# Patient Record
Sex: Female | Born: 1950 | Race: White | Hispanic: No | Marital: Married | Smoking: Never smoker
Health system: Southern US, Community
[De-identification: ages and names within clinical notes are randomized; demographics above are authoritative.]

## PROBLEM LIST (undated history)

## (undated) DIAGNOSIS — T7840XA Allergy, unspecified, initial encounter: Secondary | ICD-10-CM

## (undated) DIAGNOSIS — R112 Nausea with vomiting, unspecified: Secondary | ICD-10-CM

## (undated) DIAGNOSIS — Z9889 Other specified postprocedural states: Secondary | ICD-10-CM

## (undated) HISTORY — PX: TUBAL LIGATION: SHX77

## (undated) HISTORY — DX: Allergy, unspecified, initial encounter: T78.40XA

## (undated) HISTORY — PX: OTHER SURGICAL HISTORY: SHX169

---

## 1997-08-17 ENCOUNTER — Ambulatory Visit (HOSPITAL_COMMUNITY): Admission: RE | Admit: 1997-08-17 | Discharge: 1997-08-17 | Payer: Self-pay | Admitting: *Deleted

## 1998-12-08 ENCOUNTER — Other Ambulatory Visit: Admission: RE | Admit: 1998-12-08 | Discharge: 1998-12-08 | Payer: Self-pay | Admitting: Obstetrics and Gynecology

## 1999-02-08 ENCOUNTER — Ambulatory Visit (HOSPITAL_COMMUNITY): Admission: RE | Admit: 1999-02-08 | Discharge: 1999-02-08 | Payer: Self-pay | Admitting: Obstetrics and Gynecology

## 1999-02-08 ENCOUNTER — Encounter: Payer: Self-pay | Admitting: Obstetrics and Gynecology

## 1999-10-07 ENCOUNTER — Encounter: Payer: Self-pay | Admitting: Obstetrics and Gynecology

## 1999-10-07 ENCOUNTER — Encounter: Admission: RE | Admit: 1999-10-07 | Discharge: 1999-10-07 | Payer: Self-pay | Admitting: Obstetrics and Gynecology

## 2000-05-10 ENCOUNTER — Encounter: Payer: Self-pay | Admitting: Obstetrics and Gynecology

## 2000-05-10 ENCOUNTER — Ambulatory Visit (HOSPITAL_COMMUNITY): Admission: RE | Admit: 2000-05-10 | Discharge: 2000-05-10 | Payer: Self-pay | Admitting: Obstetrics and Gynecology

## 2001-05-28 ENCOUNTER — Other Ambulatory Visit: Admission: RE | Admit: 2001-05-28 | Discharge: 2001-05-28 | Payer: Self-pay | Admitting: Obstetrics and Gynecology

## 2001-07-22 ENCOUNTER — Ambulatory Visit (HOSPITAL_COMMUNITY): Admission: RE | Admit: 2001-07-22 | Discharge: 2001-07-22 | Payer: Self-pay | Admitting: Obstetrics and Gynecology

## 2001-07-22 ENCOUNTER — Encounter: Payer: Self-pay | Admitting: Obstetrics and Gynecology

## 2002-07-22 ENCOUNTER — Other Ambulatory Visit: Admission: RE | Admit: 2002-07-22 | Discharge: 2002-07-22 | Payer: Self-pay | Admitting: Obstetrics and Gynecology

## 2002-07-25 ENCOUNTER — Ambulatory Visit (HOSPITAL_COMMUNITY): Admission: RE | Admit: 2002-07-25 | Discharge: 2002-07-25 | Payer: Self-pay | Admitting: Obstetrics and Gynecology

## 2002-07-25 ENCOUNTER — Encounter: Payer: Self-pay | Admitting: Obstetrics and Gynecology

## 2003-09-22 ENCOUNTER — Other Ambulatory Visit: Admission: RE | Admit: 2003-09-22 | Discharge: 2003-09-22 | Payer: Self-pay | Admitting: Obstetrics and Gynecology

## 2004-09-28 ENCOUNTER — Other Ambulatory Visit: Admission: RE | Admit: 2004-09-28 | Discharge: 2004-09-28 | Payer: Self-pay | Admitting: Family Medicine

## 2004-10-03 ENCOUNTER — Ambulatory Visit (HOSPITAL_COMMUNITY): Admission: RE | Admit: 2004-10-03 | Discharge: 2004-10-03 | Payer: Self-pay | Admitting: Family Medicine

## 2005-08-21 ENCOUNTER — Encounter: Admission: RE | Admit: 2005-08-21 | Discharge: 2005-09-07 | Payer: Self-pay | Admitting: Family Medicine

## 2005-10-05 ENCOUNTER — Ambulatory Visit (HOSPITAL_COMMUNITY): Admission: RE | Admit: 2005-10-05 | Discharge: 2005-10-05 | Payer: Self-pay | Admitting: Family Medicine

## 2006-10-30 ENCOUNTER — Ambulatory Visit (HOSPITAL_COMMUNITY): Admission: RE | Admit: 2006-10-30 | Discharge: 2006-10-30 | Payer: Self-pay | Admitting: Family Medicine

## 2008-02-13 ENCOUNTER — Ambulatory Visit (HOSPITAL_COMMUNITY): Admission: RE | Admit: 2008-02-13 | Discharge: 2008-02-13 | Payer: Self-pay | Admitting: Family Medicine

## 2008-02-28 ENCOUNTER — Other Ambulatory Visit: Admission: RE | Admit: 2008-02-28 | Discharge: 2008-02-28 | Payer: Self-pay | Admitting: Family Medicine

## 2008-03-10 ENCOUNTER — Ambulatory Visit (HOSPITAL_COMMUNITY): Admission: RE | Admit: 2008-03-10 | Discharge: 2008-03-10 | Payer: Self-pay | Admitting: Family Medicine

## 2009-02-16 ENCOUNTER — Ambulatory Visit (HOSPITAL_COMMUNITY): Admission: RE | Admit: 2009-02-16 | Discharge: 2009-02-16 | Payer: Self-pay | Admitting: Family Medicine

## 2010-02-13 ENCOUNTER — Encounter: Payer: Self-pay | Admitting: Family Medicine

## 2010-02-16 ENCOUNTER — Other Ambulatory Visit (HOSPITAL_COMMUNITY): Payer: Self-pay | Admitting: Family Medicine

## 2010-02-16 DIAGNOSIS — Z139 Encounter for screening, unspecified: Secondary | ICD-10-CM

## 2010-02-22 ENCOUNTER — Ambulatory Visit (HOSPITAL_COMMUNITY)
Admission: RE | Admit: 2010-02-22 | Discharge: 2010-02-22 | Payer: Self-pay | Source: Home / Self Care | Attending: Family Medicine | Admitting: Family Medicine

## 2010-02-22 ENCOUNTER — Ambulatory Visit (HOSPITAL_COMMUNITY)
Admission: RE | Admit: 2010-02-22 | Discharge: 2010-02-22 | Disposition: A | Discharge status: Home / Self Care | Payer: Self-pay | Source: Ambulatory Visit | Attending: Family Medicine | Admitting: Family Medicine

## 2010-02-22 DIAGNOSIS — Z139 Encounter for screening, unspecified: Secondary | ICD-10-CM

## 2011-06-26 ENCOUNTER — Other Ambulatory Visit (HOSPITAL_COMMUNITY): Payer: Self-pay | Admitting: Family Medicine

## 2011-06-26 DIAGNOSIS — Z1231 Encounter for screening mammogram for malignant neoplasm of breast: Secondary | ICD-10-CM

## 2011-07-14 ENCOUNTER — Ambulatory Visit (HOSPITAL_COMMUNITY)
Admission: RE | Admit: 2011-07-14 | Discharge: 2011-07-14 | Disposition: A | Discharge status: Home / Self Care | Payer: BC Managed Care – PPO | Source: Ambulatory Visit | Attending: Family Medicine | Admitting: Family Medicine

## 2011-07-14 DIAGNOSIS — Z1231 Encounter for screening mammogram for malignant neoplasm of breast: Secondary | ICD-10-CM | POA: Insufficient documentation

## 2011-10-23 ENCOUNTER — Other Ambulatory Visit (HOSPITAL_COMMUNITY)
Admission: RE | Admit: 2011-10-23 | Discharge: 2011-10-23 | Disposition: A | Discharge status: Home / Self Care | Payer: BC Managed Care – PPO | Source: Ambulatory Visit | Attending: Family Medicine | Admitting: Family Medicine

## 2011-10-23 DIAGNOSIS — Z124 Encounter for screening for malignant neoplasm of cervix: Secondary | ICD-10-CM | POA: Insufficient documentation

## 2011-10-23 DIAGNOSIS — Z1151 Encounter for screening for human papillomavirus (HPV): Secondary | ICD-10-CM | POA: Insufficient documentation

## 2012-07-18 ENCOUNTER — Other Ambulatory Visit (HOSPITAL_COMMUNITY): Payer: Self-pay | Admitting: Family Medicine

## 2012-07-18 DIAGNOSIS — Z1231 Encounter for screening mammogram for malignant neoplasm of breast: Secondary | ICD-10-CM

## 2012-08-31 ENCOUNTER — Encounter: Payer: Self-pay | Admitting: Internal Medicine

## 2012-09-09 ENCOUNTER — Other Ambulatory Visit (HOSPITAL_COMMUNITY): Payer: Self-pay | Admitting: Family Medicine

## 2012-09-09 ENCOUNTER — Ambulatory Visit (HOSPITAL_COMMUNITY)
Admission: RE | Admit: 2012-09-09 | Discharge: 2012-09-09 | Disposition: A | Discharge status: Home / Self Care | Payer: BC Managed Care – PPO | Source: Ambulatory Visit | Attending: Family Medicine | Admitting: Family Medicine

## 2012-09-09 DIAGNOSIS — Z1231 Encounter for screening mammogram for malignant neoplasm of breast: Secondary | ICD-10-CM | POA: Insufficient documentation

## 2013-03-17 ENCOUNTER — Encounter: Payer: Self-pay | Admitting: Internal Medicine

## 2013-05-07 ENCOUNTER — Ambulatory Visit (AMBULATORY_SURGERY_CENTER): Payer: BC Managed Care – PPO | Admitting: *Deleted

## 2013-05-07 VITALS — Ht 68.0 in | Wt 165.6 lb

## 2013-05-07 DIAGNOSIS — Z1211 Encounter for screening for malignant neoplasm of colon: Secondary | ICD-10-CM

## 2013-05-07 MED ORDER — PREPOPIK 10-3.5-12 MG-GM-GM PO PACK: 1.0000 | PACK | Freq: Once | ORAL | Status: DC

## 2013-05-07 NOTE — Progress Notes (Signed)
No allergies to eggs or soy. No problems with anesthesia.  Pt given Emmi instructions for colonoscopy  No oxygen use  No diet drug use  

## 2013-05-08 ENCOUNTER — Encounter: Payer: Self-pay | Admitting: *Deleted

## 2013-05-08 ENCOUNTER — Encounter: Payer: Self-pay | Admitting: Internal Medicine

## 2013-05-14 ENCOUNTER — Encounter: Payer: Self-pay | Admitting: Internal Medicine

## 2013-05-21 ENCOUNTER — Ambulatory Visit (AMBULATORY_SURGERY_CENTER): Payer: BC Managed Care – PPO | Admitting: Internal Medicine

## 2013-05-21 ENCOUNTER — Encounter: Payer: Self-pay | Admitting: Internal Medicine

## 2013-05-21 VITALS — BP 103/63 | HR 63 | Temp 97.4°F | Resp 29 | Ht 68.0 in | Wt 165.0 lb

## 2013-05-21 DIAGNOSIS — Z1211 Encounter for screening for malignant neoplasm of colon: Secondary | ICD-10-CM

## 2013-05-21 MED ORDER — SODIUM CHLORIDE 0.9 % IV SOLN: 500.0000 mL | INTRAVENOUS | Status: DC

## 2013-05-21 NOTE — Patient Instructions (Signed)
YOU HAD AN ENDOSCOPIC PROCEDURE TODAY AT THE [facility]: Refer to the procedure report that was given to you for any specific questions about what was found during the examination.  If the procedure report does not answer your questions, please call your gastroenterologist to clarify.  If you requested that your care partner not be given the details of your procedure findings, then the procedure report has been included in a sealed envelope for you to review at your convenience later.  YOU SHOULD EXPECT: Some feelings of bloating in the abdomen. Passage of more gas than usual.  Walking can help get rid of the air that was put into your GI tract during the procedure and reduce the bloating. If you had a lower endoscopy (such as a colonoscopy or flexible sigmoidoscopy) you may notice spotting of blood in your stool or on the toilet paper. If you underwent a bowel prep for your procedure, then you may not have a normal bowel movement for a few days.  DIET: Your first meal following the procedure should be a light meal and then it is ok to progress to your normal diet.  A half-sandwich or bowl of soup is an example of a good first meal.  Heavy or fried foods are harder to digest and may make you feel nauseous or bloated.  Likewise meals heavy in dairy and vegetables can cause extra gas to form and this can also increase the bloating.  Drink plenty of fluids but you should avoid alcoholic beverages for 24 hours.  ACTIVITY: Your care partner should take you home directly after the procedure.  You should plan to take it easy, moving slowly for the rest of the day.  You can resume normal activity the day after the procedure however you should NOT DRIVE or use heavy machinery for 24 hours (because of the sedation medicines used during the test).    SYMPTOMS TO REPORT IMMEDIATELY: A gastroenterologist can be reached at any hour.  During normal business hours, 8:30 AM to 5:00 PM Monday through Friday,  call [phone].  After hours and on weekends, please call the GI answering service at [phone] who will take a message and have the physician on call contact you.   Following lower endoscopy (colonoscopy or flexible sigmoidoscopy):  Excessive amounts of blood in the stool  Significant tenderness or worsening of abdominal pains  Swelling of the abdomen that is new, acute  Fever of 100F or higher    FOLLOW UP: If any biopsies were taken you will be contacted by phone or by letter within the next 1-3 weeks.  Call your gastroenterologist if you have not heard about the biopsies in 3 weeks.  Our staff will call the home number listed on your records the next business day following your procedure to check on you and address any questions or concerns that you may have at that time regarding the information given to you following your procedure. This is a courtesy call and so if there is no answer at the home number and we have not heard from you through the emergency physician on call, we will assume that you have returned to your regular daily activities without incident.  SIGNATURES/CONFIDENTIALITY: You and/or your care partner have signed paperwork which will be entered into your electronic medical record.  These signatures attest to the fact that that the information above on your After Visit Summary has been reviewed and is understood.  Full responsibility of the confidentiality   of this discharge information lies with you and/or your care-partner.   Hemorrhoid and high fiber diet information given.  Recall colonscopy 10 years.

## 2013-05-21 NOTE — Op Note (Signed)
[facility] [address] [place], [id number]   COLONOSCOPY PROCEDURE REPORT  PATIENT: Catherine DownsCleaver, Rishita L.  MR#: [id number] BIRTHDATE: Sep 06, 1950 , 63  yrs. old GENDER: Female ENDOSCOPIST: Hart Carwinora M Stormey Wilborn, MD REFERRED GN:FAOZHBY:Wendy Corliss BlackerMcNeill, M.D. PROCEDURE DATE:  05/21/2013 PROCEDURE:   Colonoscopy, screening First Screening Colonoscopy - Avg.  risk and is 50 yrs.  old or older - No.  Prior Negative Screening - Now for repeat screening. 10 or more years since last screening  History of Adenoma - Now for follow-up colonoscopy & has been > or = to 3 yrs.  N/A  Polyps Removed Today? No.  Recommend repeat exam, <10 yrs? No. ASA CLASS:   Class II INDICATIONS:Average risk patient for colon cancer and last colonoscopy in October 2004 showed internal hemorrhoids. MEDICATIONS: MAC sedation, administered by CRNA and Propofol (Diprivan) 230 mg IV  DESCRIPTION OF PROCEDURE:   After the risks benefits and alternatives of the procedure were thoroughly explained, informed consent was obtained.  A digital rectal exam revealed no abnormalities of the rectum.   The LB PFC-H190 [id number]  endoscope was introduced through the anus and advanced to the cecum, which was identified by both the appendix and ileocecal valve. No adverse events experienced.   The quality of the prep was good, using MoviPrep  The instrument was then slowly withdrawn as the colon was fully examined.      COLON FINDINGS: Small internal hemorrhoids were found.  Retroflexed views revealed no abnormalities. The time to cecum=4 minutes 14 seconds.  Withdrawal time=8 minutes 14 seconds.  The scope was withdrawn and the procedure completed. COMPLICATIONS: There were no complications.  ENDOSCOPIC IMPRESSION: Small internal hemorrhoids  RECOMMENDATIONS: high fiber diet Recall colonoscopy in 10 years treat hemorrhoids as needed with over-the-counter preparations   eSigned:  Hart Carwinora M Kaiyu Mirabal, MD 05/21/2013 8:33  AM   cc:   PATIENT NAME:  Catherine DownsCleaver, Puja L. MR#: [id number]

## 2013-05-21 NOTE — Progress Notes (Signed)
Report to pacu rn, vss, bbs=clear 

## 2013-05-22 ENCOUNTER — Telehealth: Payer: Self-pay

## 2013-05-22 NOTE — Telephone Encounter (Signed)
  Follow up Call-  Call back number 05/21/2013  Post procedure Call Back phone  # [phone]  Permission to leave phone message Yes     Patient questions:  Do you have a fever, pain , or abdominal swelling? no Pain Score  0 *  Have you tolerated food without any problems? yes  Have you been able to return to your normal activities? yes  Do you have any questions about your discharge instructions: Diet   no Medications  no Follow up visit  no  Do you have questions or concerns about your Care? no  Actions: * If pain score is 4 or above: No action needed, pain <4.   Per the pt, "everything went perfect". maw

## 2013-09-09 ENCOUNTER — Other Ambulatory Visit (HOSPITAL_COMMUNITY): Payer: Self-pay | Admitting: Family Medicine

## 2013-09-09 DIAGNOSIS — Z1231 Encounter for screening mammogram for malignant neoplasm of breast: Secondary | ICD-10-CM

## 2013-09-30 ENCOUNTER — Ambulatory Visit (HOSPITAL_COMMUNITY)
Admission: RE | Admit: 2013-09-30 | Discharge: 2013-09-30 | Disposition: A | Discharge status: Home / Self Care | Payer: BC Managed Care – PPO | Source: Ambulatory Visit | Attending: Family Medicine | Admitting: Family Medicine

## 2013-09-30 DIAGNOSIS — Z1231 Encounter for screening mammogram for malignant neoplasm of breast: Secondary | ICD-10-CM | POA: Insufficient documentation

## 2013-11-04 ENCOUNTER — Other Ambulatory Visit: Payer: Self-pay | Admitting: Family Medicine

## 2013-11-04 DIAGNOSIS — R1011 Right upper quadrant pain: Secondary | ICD-10-CM

## 2013-11-11 ENCOUNTER — Ambulatory Visit
Admission: RE | Admit: 2013-11-11 | Discharge: 2013-11-11 | Disposition: A | Discharge status: Home / Self Care | Payer: BC Managed Care – PPO | Source: Ambulatory Visit | Attending: Family Medicine | Admitting: Family Medicine

## 2013-11-11 DIAGNOSIS — R1011 Right upper quadrant pain: Secondary | ICD-10-CM

## 2013-12-03 ENCOUNTER — Encounter (INDEPENDENT_AMBULATORY_CARE_PROVIDER_SITE_OTHER): Payer: Self-pay | Admitting: General Surgery

## 2013-12-03 NOTE — Progress Notes (Signed)
Patient ID: Catherine Bell, female   DOB: August 06, 1950, 63 y.o.   MRN: [id number]  Catherine Bell 12/03/2013 9:39 AM Location: [facility] Patient #: [id number] DOB: August 06, 1950 Married / Language: [place] / Race: White Female History of Present Illness Adolph Pollack(Shemeka Wardle J. Orlando Devereux MD; 12/03/2013 10:48 AM) Patient words: gallstones.  The patient is a 63 year old female    Note:She is referred by Dr. Gweneth DimitriWendy McNeill because of postprandial upper abdominal pain, bloating, and cholelithiasis. For about 2 years, she's had some intermittent cramping discomfort in the right upper quadrant after eating. It was infrequent. Recently, she's had more of this discomfort after eating as well as bloating and increased gas. She has not changed her diet. She eats a low-fat diet. She has no heartburn. Liver function tests demonstrate a very mild elevation of her bilirubin which is chronic otherwise were normal. Ultrasound demonstrates multiple gallstones with the largest being 1.7 cm in size. She is sent here to discuss possible cholecystectomy.  Other Problems Catherine Bell(Michelle R Brooks, [place]; 12/03/2013 9:40 AM) Hemorrhoids  Diagnostic Studies History Catherine Bell(Michelle R [place], [place]; 12/03/2013 9:40 AM) Colonoscopy within last year Mammogram within last year Pap Smear 1-5 years ago  Allergies Catherine Bell(Michelle R Brooks, CMA; 12/03/2013 9:40 AM) Advil *ANALGESICS - ANTI-INFLAMMATORY* Nausea.  Medication History Catherine Bell(Michelle R [place], [place]; 12/03/2013 9:43 AM) Dymista (137-50MCG/ACT Suspension, Nasal) Active. Vitamin D3 Complete (4000 units Oral) Active. Vitamin B6 (200MG  Tablet, Oral) Active.  Social History Catherine Bell(Michelle R [place], [place]; 12/03/2013 9:40 AM) Alcohol use Occasional alcohol use. Caffeine use Coffee, Tea. No drug use Tobacco use Never smoker.  Family History Catherine Bell(Michelle R [place], [place]; 12/03/2013 9:40 AM) Cervical Cancer Sister. Diabetes Mellitus Mother. Hypertension Mother. Respiratory  Condition Father, Sister.  Pregnancy / Birth History Catherine Bell(Michelle R [place], [place]; 12/03/2013 9:40 AM) Age at menarche 14 years. Age of menopause 70>60 Gravida 3 Maternal age 61-25 Para 3     Review of Systems Catherine Bell(Michelle R. Brooks CMA; 12/03/2013 9:40 AM) General Present- Weight Gain. Not Present- Appetite Loss, Chills, Fatigue, Fever, Night Sweats and Weight Loss. Skin Not Present- Change in Wart/Mole, Dryness, Hives, Jaundice, New Lesions, Non-Healing Wounds, Rash and Ulcer. HEENT Present- Seasonal Allergies and Wears glasses/contact lenses. Not Present- Earache, Hearing Loss, Hoarseness, Nose Bleed, Oral Ulcers, Ringing in the Ears, Sinus Pain, Sore Throat, Visual Disturbances and Yellow Eyes. Respiratory Not Present- Bloody sputum, Chronic Cough, Difficulty Breathing, Snoring and Wheezing. Breast Not Present- Breast Mass, Breast Pain, Nipple Discharge and Skin Changes. Cardiovascular Not Present- Chest Pain, Difficulty Breathing Lying Down, Leg Cramps, Palpitations, Rapid Heart Rate, Shortness of Breath and Swelling of Extremities. Gastrointestinal Present- Abdominal Pain, Bloating and Hemorrhoids. Not Present- Bloody Stool, Change in Bowel Habits, Chronic diarrhea, Constipation, Difficulty Swallowing, Excessive gas, Gets full quickly at meals, Indigestion, Nausea, Rectal Pain and Vomiting. Female Genitourinary Not Present- Frequency, Nocturia, Painful Urination, Pelvic Pain and Urgency. Musculoskeletal Not Present- Back Pain, Joint Pain, Joint Stiffness, Muscle Pain, Muscle Weakness and Swelling of Extremities. Neurological Not Present- Decreased Memory, Fainting, Headaches, Numbness, Seizures, Tingling, Tremor, Trouble walking and Weakness. Psychiatric Not Present- Anxiety, Bipolar, Change in Sleep Pattern, Depression, Fearful and Frequent crying. Endocrine Not Present- Cold Intolerance, Excessive Hunger, Hair Changes, Heat Intolerance, Hot flashes and New Diabetes. Hematology Present-  Easy Bruising. Not Present- Excessive bleeding, Gland problems, HIV and Persistent Infections.  [employer](Michelle R. Brooks CMA; 12/03/2013 9:39 AM) 12/03/2013 9:39 AM Weight: 169.13 lb Height: 69in Body Surface Area: 1.93 m Body Mass Index: 24.98 kg/m Pulse: 72 (Regular)  Resp.: 12 (Unlabored)  BP: 114/70 (Sitting, Left Arm, Standard)     Physical Exam Adolph Pollack(Shima Compere J. Joella Saefong MD; 12/03/2013 10:49 AM)  The physical exam findings are as follows: Note:General: WDWN in NAD. Pleasant and cooperative.  HEENT: [place]/AT, no facial masses  EYES: EOMI, no icterus, wears glasses.  CV: RRR, no murmur, no JVD.  CHEST: Breath sounds equal and clear. Respirations nonlabored.  ABDOMEN: Soft, nontender, nondistended, no masses, no organomegaly, no hernias.  MUSCULOSKELETAL: FROM, good muscle tone, no edema, no venous stasis changes  NEUROLOGIC: Alert and oriented, answers questions appropriately, normal gait and station.  PSYCHIATRIC: Normal mood, affect , and behavior.    Assessment & Plan   SYMPTOMATIC CHOLELITHIASIS (574.20  K80.20) Impression: We discussed laparoscopic cholecystectomy for her symptoms and gallstones. She is interested in proceeding with this.  Plan laparoscopic cholecystectomy with cholangiogram. I have explained the procedure, risks, and aftercare of cholecystectomy. Risks include but are not limited to bleeding, infection, wound problems, anesthesia, diarrhea, bile leak, injury to common bile duct/liver/intestine. She seems to understand and would like to proceed.  Catherine Bell

## 2014-01-20 ENCOUNTER — Encounter (HOSPITAL_COMMUNITY)
Admission: RE | Admit: 2014-01-20 | Discharge: 2014-01-20 | Disposition: A | Discharge status: Home / Self Care | Payer: BC Managed Care – PPO | Source: Ambulatory Visit | Attending: General Surgery | Admitting: General Surgery

## 2014-01-20 ENCOUNTER — Encounter (HOSPITAL_COMMUNITY): Payer: Self-pay

## 2014-01-20 DIAGNOSIS — Z01812 Encounter for preprocedural laboratory examination: Secondary | ICD-10-CM | POA: Diagnosis not present

## 2014-01-20 HISTORY — DX: Nausea with vomiting, unspecified: R11.2

## 2014-01-20 HISTORY — DX: Other specified postprocedural states: Z98.890

## 2014-01-20 LAB — CBC WITH DIFFERENTIAL/PLATELET
BASOS ABS: 0 10*3/uL (ref 0.0–0.1)
Basophils Relative: 1 % (ref 0–1)
Eosinophils Absolute: 0 10*3/uL (ref 0.0–0.7)
Eosinophils Relative: 0 % (ref 0–5)
HEMATOCRIT: 40 % (ref 36.0–46.0)
HEMOGLOBIN: 12.9 g/dL (ref 12.0–15.0)
Lymphocytes Relative: 20 % (ref 12–46)
Lymphs Abs: 1.1 10*3/uL (ref 0.7–4.0)
MCH: 29.5 pg (ref 26.0–34.0)
MCHC: 32.3 g/dL (ref 30.0–36.0)
MCV: 91.3 fL (ref 78.0–100.0)
MONOS PCT: 8 % (ref 3–12)
Monocytes Absolute: 0.5 10*3/uL (ref 0.1–1.0)
NEUTROS ABS: 4 10*3/uL (ref 1.7–7.7)
Neutrophils Relative %: 71 % (ref 43–77)
Platelets: 268 10*3/uL (ref 150–400)
RBC: 4.38 MIL/uL (ref 3.87–5.11)
RDW: 14.2 % (ref 11.5–15.5)
WBC: 5.7 10*3/uL (ref 4.0–10.5)

## 2014-01-20 LAB — COMPREHENSIVE METABOLIC PANEL
ALK PHOS: 70 U/L (ref 39–117)
ALT: 21 U/L (ref 0–35)
ANION GAP: 4 — AB (ref 5–15)
AST: 25 U/L (ref 0–37)
Albumin: 4.6 g/dL (ref 3.5–5.2)
BILIRUBIN TOTAL: 1.1 mg/dL (ref 0.3–1.2)
BUN: 15 mg/dL (ref 6–23)
CHLORIDE: 107 meq/L (ref 96–112)
CO2: 29 mmol/L (ref 19–32)
Calcium: 9.2 mg/dL (ref 8.4–10.5)
Creatinine, Ser: 0.55 mg/dL (ref 0.50–1.10)
GFR calc non Af Amer: >90 mL/min (ref >90)
GLUCOSE: 96 mg/dL (ref 70–99)
Potassium: 4.2 mmol/L (ref 3.5–5.1)
SODIUM: 140 mmol/L (ref 135–145)
Total Protein: 7.1 g/dL (ref 6.0–8.3)

## 2014-01-20 LAB — PROTIME-INR
INR: 1.01 (ref 0.00–1.49)
Prothrombin Time: 13.4 seconds (ref 11.6–15.2)

## 2014-01-20 LAB — APTT: APTT: 31 s (ref 24–37)

## 2014-01-20 NOTE — Patient Instructions (Signed)
20 Catherine Bell  01/20/2014   Your procedure is scheduled on:      Tuesday, January 27, 2014  [facility] and follow signs to  Short Stay Center arrive at 0730 AM.   Call this number if you have problems the morning of surgery [phone] or Presurgical Testing [phone].   Remember:  Do not eat food or drink liquids :After Midnight.  For Living Will and/or Health Care Power Attorney Forms: please provide copy for your medical record, may bring AM of surgery (forms should be already notarized-we do not provide this service).   Take these medicines the morning of surgery with A SIP OF WATER: Dymista if needed                               You may not have any metal on your body including hair pins and piercings  Do not wear jewelry, make-up, lotions, powders, perfumes or deodorant.  Do not shave body hair  48 hours(2 days) of CHG soap use.                Do not bring valuables to the hospital. [facility] IS NOT RESPONSIBLE FOR VALUABLES.  Contacts, dentures or bridgework may not be worn into surgery.    Patients discharged the day of surgery will not be allowed to drive home.  Name and phone number of your driver:Catherine Bell (husband)   ________________________________________________________________________  [facility] - Preparing for Surgery Before surgery, you can play an important role.  Because skin is not sterile, your skin needs to be as free of germs as possible.  You can reduce the number of germs on your skin by washing with CHG (chlorahexidine gluconate) soap before surgery.  CHG is an antiseptic cleaner which kills germs and bonds with the skin to continue killing germs even after washing. Please DO NOT use if you have an allergy to CHG or antibacterial soaps.  If your skin becomes reddened/irritated stop using the CHG and inform your nurse when you arrive at Short Stay. Do not shave (including legs and underarms) for at least 48  hours prior to the first CHG shower.  You may shave your face/neck. Please follow these instructions carefully:  1.  Shower with CHG Soap the night before surgery and the  morning of Surgery.  2.  If you choose to wash your hair, wash your hair first as usual with your  normal  shampoo.  3.  After you shampoo, rinse your hair and body thoroughly to remove the  shampoo.                           4.  Use CHG as you would any other liquid soap.  You can apply chg directly  to the skin and wash                       Gently with a scrungie or clean washcloth.  5.  Apply the CHG Soap to your body ONLY FROM THE NECK DOWN.   Do not use on face/ open                           Wound or open sores. Avoid contact with eyes, ears mouth and genitals (private parts).  Wash face,  Genitals (private parts) with your normal soap.             6.  Wash thoroughly, paying special attention to the area where your surgery  will be performed.  7.  Thoroughly rinse your body with warm water from the neck down.  8.  DO NOT shower/wash with your normal soap after using and rinsing off  the CHG Soap.                9.  Pat yourself dry with a clean towel.            10.  Wear clean pajamas.            11.  Place clean sheets on your bed the night of your first shower and do not  sleep with pets. Day of Surgery : Do not apply any lotions/deodorants the morning of surgery.  Please wear clean clothes to the hospital/surgery center.  FAILURE TO FOLLOW THESE INSTRUCTIONS MAY RESULT IN THE CANCELLATION OF YOUR SURGERY PATIENT SIGNATURE_________________________________  NURSE SIGNATURE__________________________________  ________________________________________________________________________

## 2014-01-27 ENCOUNTER — Other Ambulatory Visit (INDEPENDENT_AMBULATORY_CARE_PROVIDER_SITE_OTHER): Payer: Self-pay | Admitting: General Surgery

## 2014-01-27 ENCOUNTER — Ambulatory Visit (HOSPITAL_COMMUNITY): Payer: BC Managed Care – PPO | Admitting: Certified Registered Nurse Anesthetist

## 2014-01-27 ENCOUNTER — Ambulatory Visit (HOSPITAL_COMMUNITY)
Admission: RE | Admit: 2014-01-27 | Discharge: 2014-01-27 | Disposition: A | Discharge status: Home / Self Care | Payer: BC Managed Care – PPO | Source: Ambulatory Visit | Attending: General Surgery | Admitting: General Surgery

## 2014-01-27 ENCOUNTER — Encounter (HOSPITAL_COMMUNITY): Payer: Self-pay | Admitting: *Deleted

## 2014-01-27 ENCOUNTER — Ambulatory Visit (HOSPITAL_COMMUNITY): Payer: BC Managed Care – PPO

## 2014-01-27 ENCOUNTER — Encounter (HOSPITAL_COMMUNITY)
Admission: RE | Disposition: A | Discharge status: Home / Self Care | Payer: Self-pay | Source: Ambulatory Visit | Attending: General Surgery

## 2014-01-27 DIAGNOSIS — R1011 Right upper quadrant pain: Secondary | ICD-10-CM | POA: Diagnosis present

## 2014-01-27 DIAGNOSIS — K8020 Calculus of gallbladder without cholecystitis without obstruction: Secondary | ICD-10-CM

## 2014-01-27 DIAGNOSIS — Z79899 Other long term (current) drug therapy: Secondary | ICD-10-CM | POA: Insufficient documentation

## 2014-01-27 DIAGNOSIS — K8010 Calculus of gallbladder with chronic cholecystitis without obstruction: Secondary | ICD-10-CM | POA: Diagnosis not present

## 2014-01-27 HISTORY — PX: CHOLECYSTECTOMY: SHX55

## 2014-01-27 SURGERY — LAPAROSCOPIC CHOLECYSTECTOMY WITH INTRAOPERATIVE CHOLANGIOGRAM
Anesthesia: General

## 2014-01-27 MED ORDER — LIDOCAINE HCL (CARDIAC) 20 MG/ML IV SOLN
INTRAVENOUS | Status: DC | PRN
  Administered 2014-01-27: 80 mg via INTRAVENOUS

## 2014-01-27 MED ORDER — MEPERIDINE HCL 50 MG/ML IJ SOLN: 6.2500 mg | INTRAMUSCULAR | Status: DC | PRN

## 2014-01-27 MED ORDER — ACETAMINOPHEN 325 MG PO TABS: 325.0000 mg | ORAL_TABLET | ORAL | Status: DC | PRN

## 2014-01-27 MED ORDER — NEOSTIGMINE METHYLSULFATE 10 MG/10ML IV SOLN
INTRAVENOUS | Status: AC
  Filled 2014-01-27: qty 1

## 2014-01-27 MED ORDER — PROMETHAZINE HCL 25 MG/ML IJ SOLN
6.2500 mg | INTRAMUSCULAR | Status: DC | PRN
  Administered 2014-01-27: 6.25 mg via INTRAVENOUS
  Filled 2014-01-27: qty 1

## 2014-01-27 MED ORDER — ACETAMINOPHEN 325 MG PO TABS: 650.0000 mg | ORAL_TABLET | ORAL | Status: DC | PRN

## 2014-01-27 MED ORDER — MIDAZOLAM HCL 2 MG/2ML IJ SOLN
INTRAMUSCULAR | Status: AC
  Filled 2014-01-27: qty 2

## 2014-01-27 MED ORDER — SUCCINYLCHOLINE CHLORIDE 20 MG/ML IJ SOLN
INTRAMUSCULAR | Status: DC | PRN
  Administered 2014-01-27: 100 mg via INTRAVENOUS

## 2014-01-27 MED ORDER — BUPIVACAINE HCL 0.5 % IJ SOLN
INTRAMUSCULAR | Status: DC | PRN
  Administered 2014-01-27: 10 mL

## 2014-01-27 MED ORDER — PROPOFOL 10 MG/ML IV BOLUS
INTRAVENOUS | Status: DC | PRN
  Administered 2014-01-27: 150 mg via INTRAVENOUS

## 2014-01-27 MED ORDER — SODIUM CHLORIDE 0.9 % IJ SOLN
INTRAMUSCULAR | Status: AC
  Filled 2014-01-27: qty 10

## 2014-01-27 MED ORDER — DEXTROSE IN LACTATED RINGERS 5 % IV SOLN
INTRAVENOUS | Status: DC
Start: 2014-01-27 — End: 2014-01-27

## 2014-01-27 MED ORDER — DEXAMETHASONE SODIUM PHOSPHATE 10 MG/ML IJ SOLN
INTRAMUSCULAR | Status: DC | PRN
  Administered 2014-01-27: 10 mg via INTRAVENOUS

## 2014-01-27 MED ORDER — HYDROMORPHONE HCL 1 MG/ML IJ SOLN
0.2500 mg | INTRAMUSCULAR | Status: DC | PRN
  Administered 2014-01-27: 0.5 mg via INTRAVENOUS

## 2014-01-27 MED ORDER — FENTANYL CITRATE 0.05 MG/ML IJ SOLN
INTRAMUSCULAR | Status: DC | PRN
  Administered 2014-01-27 (×5): 50 ug via INTRAVENOUS

## 2014-01-27 MED ORDER — EPHEDRINE SULFATE 50 MG/ML IJ SOLN
INTRAMUSCULAR | Status: DC | PRN
  Administered 2014-01-27: 5 mg via INTRAVENOUS

## 2014-01-27 MED ORDER — ONDANSETRON HCL 4 MG/2ML IJ SOLN
INTRAMUSCULAR | Status: DC | PRN
  Administered 2014-01-27: 4 mg via INTRAVENOUS

## 2014-01-27 MED ORDER — EPHEDRINE SULFATE 50 MG/ML IJ SOLN
INTRAMUSCULAR | Status: AC
  Filled 2014-01-27: qty 1

## 2014-01-27 MED ORDER — METOCLOPRAMIDE HCL 5 MG/ML IJ SOLN
INTRAMUSCULAR | Status: DC | PRN
  Administered 2014-01-27: 10 mg via INTRAVENOUS

## 2014-01-27 MED ORDER — GLYCOPYRROLATE 0.2 MG/ML IJ SOLN
INTRAMUSCULAR | Status: DC | PRN
  Administered 2014-01-27: 0.4 mg via INTRAVENOUS
  Administered 2014-01-27: 0.2 mg via INTRAVENOUS

## 2014-01-27 MED ORDER — CEFAZOLIN SODIUM-DEXTROSE 2-3 GM-% IV SOLR
INTRAVENOUS | Status: AC
  Filled 2014-01-27: qty 50

## 2014-01-27 MED ORDER — BUPIVACAINE HCL (PF) 0.5 % IJ SOLN
INTRAMUSCULAR | Status: AC
  Filled 2014-01-27: qty 30

## 2014-01-27 MED ORDER — LACTATED RINGERS IV SOLN
INTRAVENOUS | Status: DC | PRN
  Administered 2014-01-27: 1000 mL via INTRAVENOUS

## 2014-01-27 MED ORDER — ACETAMINOPHEN 160 MG/5ML PO SOLN: 325.0000 mg | ORAL | Status: DC | PRN

## 2014-01-27 MED ORDER — NEOSTIGMINE METHYLSULFATE 10 MG/10ML IV SOLN
INTRAVENOUS | Status: DC | PRN
  Administered 2014-01-27: 3.5 mg via INTRAVENOUS

## 2014-01-27 MED ORDER — HYDROMORPHONE HCL 1 MG/ML IJ SOLN
INTRAMUSCULAR | Status: DC
Start: 2014-01-27 — End: 2014-01-27
  Filled 2014-01-27: qty 1

## 2014-01-27 MED ORDER — MIDAZOLAM HCL 5 MG/5ML IJ SOLN
INTRAMUSCULAR | Status: DC | PRN
  Administered 2014-01-27: 2 mg via INTRAVENOUS

## 2014-01-27 MED ORDER — FENTANYL CITRATE 0.05 MG/ML IJ SOLN
INTRAMUSCULAR | Status: AC
  Filled 2014-01-27: qty 5

## 2014-01-27 MED ORDER — LIDOCAINE HCL (CARDIAC) 20 MG/ML IV SOLN
INTRAVENOUS | Status: AC
  Filled 2014-01-27: qty 5

## 2014-01-27 MED ORDER — ROCURONIUM BROMIDE 100 MG/10ML IV SOLN
INTRAVENOUS | Status: AC
  Filled 2014-01-27: qty 1

## 2014-01-27 MED ORDER — ONDANSETRON HCL 4 MG/2ML IJ SOLN
INTRAMUSCULAR | Status: AC
  Filled 2014-01-27: qty 2

## 2014-01-27 MED ORDER — SODIUM CHLORIDE 0.9 % IJ SOLN: 3.0000 mL | INTRAMUSCULAR | Status: DC | PRN

## 2014-01-27 MED ORDER — GLYCOPYRROLATE 0.2 MG/ML IJ SOLN
INTRAMUSCULAR | Status: AC
  Filled 2014-01-27: qty 3

## 2014-01-27 MED ORDER — ONDANSETRON HCL 4 MG/2ML IJ SOLN
4.0000 mg | INTRAMUSCULAR | Status: DC | PRN
Start: 2014-01-27 — End: 2014-01-27

## 2014-01-27 MED ORDER — LACTATED RINGERS IV SOLN
INTRAVENOUS | Status: DC | PRN
  Administered 2014-01-27 (×2): via INTRAVENOUS

## 2014-01-27 MED ORDER — IOHEXOL 300 MG/ML  SOLN
INTRAMUSCULAR | Status: DC | PRN
  Administered 2014-01-27: 2 mL via INTRAVENOUS

## 2014-01-27 MED ORDER — PROPOFOL 10 MG/ML IV BOLUS
INTRAVENOUS | Status: AC
  Filled 2014-01-27: qty 20

## 2014-01-27 MED ORDER — ATROPINE SULFATE 0.4 MG/ML IJ SOLN
INTRAMUSCULAR | Status: AC
  Filled 2014-01-27: qty 1

## 2014-01-27 MED ORDER — OXYCODONE HCL 5 MG PO TABS
5.0000 mg | ORAL_TABLET | ORAL | Status: DC | PRN
  Administered 2014-01-27: 5 mg via ORAL
  Filled 2014-01-27: qty 1

## 2014-01-27 MED ORDER — OXYCODONE HCL 5 MG PO TABS: 5.0000 mg | ORAL_TABLET | ORAL | Status: DC | PRN

## 2014-01-27 MED ORDER — 0.9 % SODIUM CHLORIDE (POUR BTL) OPTIME
TOPICAL | Status: DC | PRN
Start: 2014-01-27 — End: 2014-01-27
  Administered 2014-01-27: 1000 mL

## 2014-01-27 MED ORDER — MIDAZOLAM HCL 2 MG/2ML IJ SOLN: 0.5000 mg | Freq: Once | INTRAMUSCULAR | Status: DC | PRN

## 2014-01-27 MED ORDER — PROMETHAZINE HCL 12.5 MG PO TABS: 12.5000 mg | ORAL_TABLET | Freq: Four times a day (QID) | ORAL | Status: DC | PRN

## 2014-01-27 MED ORDER — METOCLOPRAMIDE HCL 5 MG/ML IJ SOLN
INTRAMUSCULAR | Status: AC
  Filled 2014-01-27: qty 2

## 2014-01-27 MED ORDER — CEFAZOLIN SODIUM-DEXTROSE 2-3 GM-% IV SOLR
2.0000 g | INTRAVENOUS | Status: AC
  Administered 2014-01-27: 2 g via INTRAVENOUS

## 2014-01-27 MED ORDER — ACETAMINOPHEN 650 MG RE SUPP
650.0000 mg | RECTAL | Status: DC | PRN
  Filled 2014-01-27: qty 1

## 2014-01-27 MED ORDER — ROCURONIUM BROMIDE 100 MG/10ML IV SOLN
INTRAVENOUS | Status: DC | PRN
  Administered 2014-01-27: 25 mg via INTRAVENOUS

## 2014-01-27 MED ORDER — MORPHINE SULFATE 10 MG/ML IJ SOLN: 2.0000 mg | INTRAMUSCULAR | Status: DC | PRN

## 2014-01-27 SURGICAL SUPPLY — 42 items
APL SKNCLS STERI-STRIP NONHPOA (GAUZE/BANDAGES/DRESSINGS) ×1
APPLIER CLIP 5 13 M/L LIGAMAX5 (MISCELLANEOUS) ×3
APR CLP MED LRG 5 ANG JAW (MISCELLANEOUS) ×1
BAG SPEC RTRVL LRG 6X4 10 (ENDOMECHANICALS) ×1
BENZOIN TINCTURE PRP APPL 2/3 (GAUZE/BANDAGES/DRESSINGS) ×3 IMPLANT
CHLORAPREP W/TINT 26ML (MISCELLANEOUS) ×3 IMPLANT
CLIP APPLIE 5 13 M/L LIGAMAX5 (MISCELLANEOUS) ×1 IMPLANT
CLOSURE WOUND 1/2 X4 (GAUZE/BANDAGES/DRESSINGS) ×1
COVER MAYO STAND STRL (DRAPES) ×3 IMPLANT
DECANTER SPIKE VIAL GLASS SM (MISCELLANEOUS) ×3 IMPLANT
DISSECTOR BLUNT TIP ENDO 5MM (MISCELLANEOUS) IMPLANT
DRAPE C-ARM 42X120 X-RAY (DRAPES) ×3 IMPLANT
DRAPE LAPAROSCOPIC ABDOMINAL (DRAPES) ×3 IMPLANT
DRAPE UTILITY XL STRL (DRAPES) ×3 IMPLANT
DRSG TEGADERM 2-3/8X2-3/4 SM (GAUZE/BANDAGES/DRESSINGS) ×9 IMPLANT
DRSG TEGADERM 4X4.75 (GAUZE/BANDAGES/DRESSINGS) ×2 IMPLANT
ELECT REM PT RETURN 9FT ADLT (ELECTROSURGICAL) ×3
ELECTRODE REM PT RTRN 9FT ADLT (ELECTROSURGICAL) ×1 IMPLANT
ENDOLOOP SUT PDS II  0 18 (SUTURE)
ENDOLOOP SUT PDS II 0 18 (SUTURE) IMPLANT
GAUZE SPONGE 2X2 8PLY STRL LF (GAUZE/BANDAGES/DRESSINGS) ×1 IMPLANT
GLOVE ECLIPSE 8.0 STRL XLNG CF (GLOVE) ×3 IMPLANT
GLOVE INDICATOR 8.0 STRL GRN (GLOVE) ×3 IMPLANT
GOWN STRL REUS W/TWL XL LVL3 (GOWN DISPOSABLE) ×9 IMPLANT
HEMOSTAT SNOW SURGICEL 2X4 (HEMOSTASIS) IMPLANT
KIT BASIN OR (CUSTOM PROCEDURE TRAY) ×3 IMPLANT
POUCH SPECIMEN RETRIEVAL 10MM (ENDOMECHANICALS) ×3 IMPLANT
SCISSORS LAP 5X35 DISP (ENDOMECHANICALS) ×3 IMPLANT
SET CHOLANGIOGRAPH MIX (MISCELLANEOUS) ×3 IMPLANT
SET IRRIG TUBING LAPAROSCOPIC (IRRIGATION / IRRIGATOR) ×3 IMPLANT
SLEEVE XCEL OPT CAN 5 100 (ENDOMECHANICALS) ×3 IMPLANT
SOLUTION ANTI FOG 6CC (MISCELLANEOUS) ×3 IMPLANT
SPONGE GAUZE 2X2 STER 10/PKG (GAUZE/BANDAGES/DRESSINGS) ×2
STRIP CLOSURE SKIN 1/2X4 (GAUZE/BANDAGES/DRESSINGS) ×2 IMPLANT
SUT MNCRL AB 4-0 PS2 18 (SUTURE) ×3 IMPLANT
TOWEL OR 17X26 10 PK STRL BLUE (TOWEL DISPOSABLE) ×3 IMPLANT
TOWEL OR NON WOVEN STRL DISP B (DISPOSABLE) ×3 IMPLANT
TRAY LAPAROSCOPIC (CUSTOM PROCEDURE TRAY) ×3 IMPLANT
TROCAR BLADELESS OPT 5 100 (ENDOMECHANICALS) ×3 IMPLANT
TROCAR XCEL BLUNT TIP 100MML (ENDOMECHANICALS) ×3 IMPLANT
TROCAR XCEL NON-BLD 11X100MML (ENDOMECHANICALS) IMPLANT
TUBING INSUFFLATION 10FT LAP (TUBING) ×3 IMPLANT

## 2014-01-27 NOTE — Anesthesia Preprocedure Evaluation (Signed)
Anesthesia Evaluation  Patient identified by MRN, date of birth, ID band Patient awake    Reviewed: Allergy & Precautions, H&P , Patient's Chart, lab work & pertinent test results, reviewed documented beta blocker date and time   History of Anesthesia Complications (+) PONVNegative for: history of anesthetic complications  Airway Mallampati: II  TM Distance: >3 FB Neck ROM: full    Dental   Pulmonary  breath sounds clear to auscultation        Cardiovascular Exercise Tolerance: Good Rhythm:regular Rate:Normal     Neuro/Psych    GI/Hepatic   Endo/Other    Renal/GU      Musculoskeletal   Abdominal   Peds  Hematology   Anesthesia Other Findings   Reproductive/Obstetrics                             Anesthesia Physical Anesthesia Plan  ASA: II  Anesthesia Plan: General ETT   Post-op Pain Management:    Induction:   Airway Management Planned:   Additional Equipment:   Intra-op Plan:   Post-operative Plan:   Informed Consent: I have reviewed the patients History and Physical, chart, labs and discussed the procedure including the risks, benefits and alternatives for the proposed anesthesia with the patient or authorized representative who has indicated his/her understanding and acceptance.   Dental Advisory Given  Plan Discussed with: CRNA and Surgeon  Anesthesia Plan Comments:         Anesthesia Quick Evaluation

## 2014-01-27 NOTE — H&P (Signed)
Catherine Bell is an 64 y.o. female.   Chief Complaint:   Here for elective cholecystectomy HPI:   For about 2 years, she's had some intermittent cramping discomfort in the right upper quadrant after eating. It was infrequent. Recently, she's had more of this discomfort after eating as well as bloating and increased gas. She has not changed her diet. She eats a low-fat diet. She has no heartburn. Liver function tests demonstrate a very mild elevation of her bilirubin which is chronic otherwise were normal. Ultrasound demonstrates multiple gallstones with the largest being 1.7 cm in size.   Past Medical History  Diagnosis Date  . Allergy   . PONV (postoperative nausea and vomiting)     Past Surgical History  Procedure Laterality Date  . Tubal ligation    . Colonscopy       Family History  Problem Relation Age of Onset  . Colon cancer Neg Hx   . Diabetes Mother   . Heart disease Mother   . Lung cancer Father   . Uterine cancer Sister    Social History:  reports that she has never smoked. She has never used smokeless tobacco. She reports that she drinks alcohol. She reports that she does not use illicit drugs.  Allergies:  Allergies  Allergen Reactions  . Advil [Ibuprofen] Nausea And Vomiting    Can only take 1 tab anything more will irritate stomach.   . Vitamin C Nausea And Vomiting    "upsets stomach"    Medications Prior to Admission  Medication Sig Dispense Refill  . Azelastine-Fluticasone (DYMISTA) 137-50 MCG/ACT SUSP Place 1 spray into the nose 2 (two) times daily as needed (allergies).     . Cholecalciferol (HM VITAMIN D3) 4000 UNITS CAPS Take 1 capsule by mouth every morning.     Marland Kitchen. ibuprofen (ADVIL,MOTRIN) 200 MG tablet Take 200 mg by mouth daily as needed for headache or mild pain.    . Magnesium 500 MG TABS Take 1 tablet by mouth daily as needed (sleep).    Marland Kitchen. PRESCRIPTION MEDICATION Place 1 drop into the right ear daily as needed (x7 days for flare up.).  Mometasone    . pyridOXINE (VITAMIN B-6) 100 MG tablet Take 100 mg by mouth every morning.       No results found for this or any previous visit (from the past 48 hour(s)). No results found.  Review of Systems  Constitutional: Negative for fever and chills.  HENT: Negative for congestion and sore throat.   Respiratory: Negative for cough.     Blood pressure 123/46, pulse 74, temperature 97.6 F (36.4 C), temperature source Oral, resp. rate 16, height 5\' 8"  (1.727 m), weight 162 lb 2 oz (73.539 kg), SpO2 100 %. Physical Exam  Constitutional: She appears well-developed and well-nourished. No distress.  Eyes: No scleral icterus.  Cardiovascular: Normal rate and regular rhythm.   Respiratory: Effort normal and breath sounds normal.  GI: Soft. There is no tenderness.  Musculoskeletal: She exhibits no edema.  Neurological: She is alert.  Skin: Skin is warm and dry.     Assessment/Plan Symptomatic cholelithiasis  Plan:  Laparoscopic cholecystectomy with IOC  Lanie Schelling J 01/27/2014, 9:16 AM

## 2014-01-27 NOTE — Discharge Instructions (Signed)
[facility] ______[facility] LAPAROSCOPIC SURGERY: POST OP INSTRUCTIONS Always review your discharge instruction sheet given to you by the facility where your surgery was performed. IF YOU HAVE DISABILITY OR FAMILY LEAVE FORMS, YOU MUST BRING THEM TO THE OFFICE FOR PROCESSING.   DO NOT GIVE THEM TO YOUR DOCTOR.  1. A prescription for pain medication may be given to you upon discharge.  Take your pain medication as prescribed, if needed.  If narcotic pain medicine is not needed, then you may take acetaminophen (Tylenol) or ibuprofen (Advil) as needed. 2. Take your usually prescribed medications unless otherwise directed. 3. If you need a refill on your pain medication, please contact your pharmacy.  They will contact our office to request authorization. Prescriptions will not be filled after 5pm or on week-ends. 4. You should follow a light diet the first few days after arrival home, such as soup and crackers, etc.  Be sure to include lots of fluids daily.  Start a solid, lowfat diet 2 days after your surgery. 5. Most patients will experience some swelling and bruising in the area of the incisions.  Ice packs will help.  Swelling and bruising can take several days to resolve.  6. It is common to experience some constipation if taking pain medication after surgery.  Increasing fluid intake and taking a stool softener (such as Colace) will usually help or prevent this problem from occurring.  A mild laxative (Milk of Magnesia or Miralax) should be taken according to package instructions if there are no bowel movements after 48 hours. 7. Unless discharge instructions indicate otherwise, you may remove your bandages 72 hours after surgery, and you may shower at that time.  You may have steri-strips (small skin tapes) in place directly over the incision.  These strips should be left on the skin.  If your surgeon used skin glue on the incision, you may shower in 24 hours.  The glue will flake off  over the next 2-3 weeks.  Any sutures or staples will be removed at the office during your follow-up visit. 8. ACTIVITIES:  You may resume regular (light) daily activities beginning the next day--such as daily self-care, walking, climbing stairs--gradually increasing activities as tolerated.  You may have sexual intercourse when it is comfortable.  Refrain from any heavy lifting or straining-nothing over 10 pounds for 2 weeks.  a. You may drive when you are no longer taking prescription pain medication, you can comfortably wear a seatbelt, and you can safely maneuver your car and apply brakes. b. RETURN TO WORK: Desk work in one week, full duty in two if pain free. __________________________________________________________ 9. You should see your doctor in the office for a follow-up appointment approximately 2-3 weeks after your surgery.  Make sure that you call for this appointment within a day or two after you arrive home to insure a convenient appointment time. 10. OTHER INSTRUCTIONS: __________________________________________________________________________________________________________________________ __________________________________________________________________________________________________________________________ WHEN TO CALL YOUR DOCTOR: 1. Fever over 101.0 2. Inability to urinate 3. Continued bleeding from incision. 4. Increased pain, redness, or drainage from the incision. 5. Increasing abdominal pain  The clinic staff is available to answer your questions during regular business hours.  Please dont hesitate to call and ask to speak to one of the nurses for clinical concerns.  If you have a medical emergency, go to the nearest emergency room or call 911.  A surgeon from [facility] is always on call at the hospital. [address], [address], [place], [place]  [address] ? [address], [place], [place]   [address] [phone] ? [phone] ? FAX [phone]  [phone] Web site: [email or web address]

## 2014-01-27 NOTE — Anesthesia Postprocedure Evaluation (Signed)
  Anesthesia Post Note  Patient: Catherine Bell  Procedure(s) Performed: Procedure(s) (LRB): LAPAROSCOPIC CHOLECYSTECTOMY WITH INTRAOPERATIVE CHOLANGIOGRAM (N/A)  Anesthesia type: GA  Patient location: PACU  Post pain: Pain level controlled  Post assessment: Post-op Vital signs reviewed  Last Vitals:  Filed Vitals:   01/27/14 1555  BP: 111/42  Pulse: 75  Temp: 36.8 C  Resp: 16    Post vital signs: Reviewed  Level of consciousness: sedated  Complications: No apparent anesthesia complications

## 2014-01-27 NOTE — Transfer of Care (Signed)
Immediate Anesthesia Transfer of Care Note  Patient: Catherine Bell  Procedure(s) Performed: Procedure(s) (LRB): LAPAROSCOPIC CHOLECYSTECTOMY WITH INTRAOPERATIVE CHOLANGIOGRAM (N/A)  Patient Location: PACU  Anesthesia Type: General  Level of Consciousness: sedated, patient cooperative and responds to stimulation  Airway & Oxygen Therapy: Patient Spontanous Breathing and Patient connected to face mask oxgen  Post-op Assessment: Report given to PACU RN and Post -op Vital signs reviewed and stable  Post vital signs: Reviewed and stable  Complications: No apparent anesthesia complications

## 2014-01-27 NOTE — Op Note (Signed)
Preoperative diagnosis:  Symptomatic Cholelithiasis  Postoperative diagnosis:  Same    Procedure: Laparoscopic cholecystectomy with cholangiogram.  Surgeon: Avel Peaceodd Aneisha Skyles, M.D.  Asst.:  Estelle GrumblesSteve Gross, M.D.  Anesthesia: General  Indication:   This is a 64 year old female with gallstones and biliary colic.  She presents for elective cholecystectomy.  Technique: She was brought to the operating room, placed supine on the operating table, and a general anesthetic was administered. The abdominal wall was then sterilely prepped and draped. Local anesthetic (Marcaine) was infiltrated in the subumbilical region. A small subumbilical incision was made through the skin, subcutaneous tissue, fascia, and peritoneum entering the peritoneal cavity under direct vision. A pursestring suture of 0 Vicryl was placed around the edges of the fascia. A Hassan trocar was introduced into the peritoneal cavity and a pneumoperitoneum was created by insufflation of carbon dioxide gas. The laparoscope was introduced into the trocar and no underlying bleeding or organ injury was noted. He/She was then placed in the reverse Trendelenburg position with the right side tilted slightly up.  Three 5 mm trocars were then placed into the abdominal cavity under laparoscopic vision. One in the epigastric area, and 2 in the right upper quadrant area. The gallbladder was visualized and the fundus was grasped and retracted toward the right shoulder. Chronic inflammatory changes were noted.  Adhesions between the gallbladder and omentum were divided. The infundibulum was mobilized with dissection close to the gallbladder and retracted laterally. The cystic duct was identified and a window was created around it. The cystic artery was also identified and a window was created around it. The critical view was achieved. A clip was placed at the neck of the gallbladder. A small incision was made in the cystic duct. A cholangiocatheter was  introduced through the anterior abdominal wall and placed in the cystic duct. A intraoperative cholangiogram was then performed.  Under real-time fluoroscopy, dilute contrast was injected into the cystic duct.  The common hepatic duct, the right and left hepatic ducts, and the common duct were all visualized. Contrast drained into the duodenum without obvious evidence of any obstructing ductal lesion. The final report is pending the Radiologist's interpretation.  The cholangiocatheter was removed, the cystic duct was clipped 3 times on the biliary side, and then the cystic duct was divided sharply. No bile leak was noted from the cystic duct stump.  The cystic artery was then clipped and divided. Following this the gallbladder was dissected free from the liver using electrocautery. The gallbladder was then placed in a retrieval bag and removed from the abdominal cavity through the subumbilical incision.  The gallbladder fossa was inspected, irrigated, and bleeding was controlled with electrocautery. Inspection showed that hemostasis was adequate and there was no evidence of bile leak.  The irrigation fluid was evacuated as much as possible.  The subumbilical trocar was removed and the fascial defect was closed by tightening and tying down the pursestring suture under laparoscopic vision.  The remaining trocars were removed and the pneumoperitoneum was released. The skin incisions were closed with 4-0 Monocryl subcuticular stitches. Steri-Strips and sterile dressings were applied.  The procedure was well-tolerated without any apparent complications. She was taken to the recovery room in satisfactory condition.

## 2014-01-27 NOTE — Progress Notes (Signed)
Patient states no pain or nausea after anti-emetic and po pain med given. Ambulated x2 to restroom with RN and voided. Drinking well. Husband has prescriptions and pt and husband aware of discharge instructions. Discharged home with husband at 1600.

## 2014-01-28 ENCOUNTER — Encounter (HOSPITAL_COMMUNITY): Payer: Self-pay | Admitting: General Surgery

## 2014-06-25 ENCOUNTER — Encounter: Payer: Self-pay | Admitting: Internal Medicine

## 2014-12-03 ENCOUNTER — Other Ambulatory Visit: Payer: Self-pay

## 2014-12-03 DIAGNOSIS — Z1231 Encounter for screening mammogram for malignant neoplasm of breast: Secondary | ICD-10-CM

## 2014-12-25 ENCOUNTER — Ambulatory Visit
Admission: RE | Admit: 2014-12-25 | Discharge: 2014-12-25 | Disposition: A | Discharge status: Home / Self Care | Payer: BC Managed Care – PPO | Source: Ambulatory Visit

## 2014-12-25 DIAGNOSIS — Z1231 Encounter for screening mammogram for malignant neoplasm of breast: Secondary | ICD-10-CM

## 2015-02-24 ENCOUNTER — Ambulatory Visit (INDEPENDENT_AMBULATORY_CARE_PROVIDER_SITE_OTHER): Payer: BC Managed Care – PPO | Admitting: Family Medicine

## 2015-02-24 ENCOUNTER — Encounter: Payer: Self-pay | Admitting: Family Medicine

## 2015-02-24 VITALS — BP 118/78 | HR 77 | Ht 68.0 in | Wt 167.0 lb

## 2015-02-24 DIAGNOSIS — M2021 Hallux rigidus, right foot: Secondary | ICD-10-CM | POA: Diagnosis not present

## 2015-02-24 DIAGNOSIS — M7552 Bursitis of left shoulder: Secondary | ICD-10-CM | POA: Diagnosis not present

## 2015-02-24 DIAGNOSIS — M2020 Hallux rigidus, unspecified foot: Secondary | ICD-10-CM | POA: Insufficient documentation

## 2015-02-24 DIAGNOSIS — G5761 Lesion of plantar nerve, right lower limb: Secondary | ICD-10-CM | POA: Insufficient documentation

## 2015-02-24 NOTE — Assessment & Plan Note (Signed)
Patient is very minimal bursitis at this time. We discussed lifting mechanics to keep his within peripheral vision. No significant restrictions at this time. Patient has any worsening symptoms she'll come back. Do not find anything that makes me too concern.

## 2015-02-24 NOTE — Assessment & Plan Note (Signed)
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 I do think that this is continuing to give her trouble. Ultrasound today show very minimal signs and symptoms of possible remote stress fracture. Seems to be healing. Very small Morton's neuroma that could also be contribute in. Otherwise patient likely will do well. We discussed proper shoe choices, given anti-inflammatory topically to try. Icing regimen. Patient has worsening symptoms she'll come back and see me again in 4 weeks. Otherwise we will like to see patient again

## 2015-02-24 NOTE — Patient Instructions (Signed)
Good to meet you  Ice bath at night Spenco orthotics "total support" online would be great  Good shoes with rigid bottom.  Dierdre Harness, Merrell or New balance greater then 700 Also consider allegria or xelero could be a good benefit Wear shoes or sandles in the house pennsaid pinkie amount topically 2 times daily as needed for the shoulder and the foot.  Vitamin D is great  Turmeric  2 times daily  Tart cherry extract at night See em again in 4 weeks.

## 2015-02-24 NOTE — Progress Notes (Signed)
[name] [address] [place], [place] [address] Phone: [phone] Subjective:    I'm seeing this patient by the request  of:  MCNEILL,WENDY, MD   CC: Left shoulder pain and right foot pain  [id number] Catherine Bell is a 65 y.o. female coming in with complaint of left shoulder pain and right foot pain. Regarding patient's left shoulder she states it is a very intermittent sharp pain that had symptoms at last seconds. Not stopping her from any true activity. Seems to be when she adductor across her midline. Very mild sharp pain happens. Usually has to be with resistance. Such as when she is in the gym. Sometimes reaching above her head as well. No weakness, no radiation of pain, no numbness. No pain at night. Rates the severity of pain a 4 out of 10. Just does not want to happen or worsen. Has not notice any increasing frequency.  Patient is also having right foot pain. Patient has been walking a significant amount more. States that it seems to be mostly between the first and second toes. Can be associated with some numbness. No swelling. Can feeling on the dorsal as well as plantar aspect of the foot. States that he can be even uncomfortable nonweightbearing. Patient has been doing yoga as well and notices a little bit when she puts pressure on the first toe. Does not remember any true injury. Rates the severity of pain as 5 out of 10.     Past Medical History  Diagnosis Date  . Allergy   . PONV (postoperative nausea and vomiting)    Past Surgical History  Procedure Laterality Date  . Tubal ligation    . Colonscopy     . Cholecystectomy N/A 01/27/2014    Procedure: LAPAROSCOPIC CHOLECYSTECTOMY WITH INTRAOPERATIVE CHOLANGIOGRAM;  Surgeon: Avel Peace, MD;  Location: [facility];  Service: General;  Laterality: N/A;   Social History   Social History  . Marital Status: Married    Spouse Name: N/A  . Number of Children: N/A  . Years of Education:  N/A   Social History Main Topics  . Smoking status: Never Smoker   . Smokeless tobacco: Never Used  . Alcohol Use: Yes     Comment: occas beer  . Drug Use: No  . Sexual Activity: Not Asked   Other Topics Concern  . None   Social History Narrative   Allergies  Allergen Reactions  . Advil [Ibuprofen] Nausea And Vomiting    Can only take 1 tab anything more will irritate stomach.   . Vitamin C Nausea And Vomiting    "upsets stomach"   Family History  Problem Relation Age of Onset  . Colon cancer Neg Hx   . Diabetes Mother   . Heart disease Mother   . Lung cancer Father   . Uterine cancer Sister     Past medical history, social, surgical and family history all reviewed in electronic medical record.  No pertanent information unless stated regarding to the chief complaint.   Review of Systems: No headache, visual changes, nausea, vomiting, diarrhea, constipation, dizziness, abdominal pain, skin rash, fevers, chills, night sweats, weight loss, swollen lymph nodes, body aches, joint swelling, muscle aches, chest pain, shortness of breath, mood changes.   Objective Blood pressure 118/78, pulse 77, height  (1.727 m), weight 167 lb (75.751 kg), SpO2 97 %.  General: No apparent distress alert and oriented x3 mood and affect normal, dressed appropriately.  HEENT: Pupils  equal, extraocular movements intact  Respiratory: Patient's speak in full sentences and does not appear short of breath  Cardiovascular: No lower extremity edema, non tender, no erythema  Skin: Warm dry intact with no signs of infection or rash on extremities or on axial skeleton.  Abdomen: Soft nontender  Neuro: Cranial nerves II through XII are intact, neurovascularly intact in all extremities with 2+ DTRs and 2+ pulses.  Lymph: No lymphadenopathy of posterior or anterior cervical chain or axillae bilaterally.  Gait normal with good balance and coordination.  MSK:  Non tender with full range of motion and good  stability and symmetric strength and tone of  elbows, wrist, hip, knee and ankles bilaterally.  Shoulder: Left Inspection reveals no abnormalities, atrophy or asymmetry. Palpation is normal with no tenderness over AC joint or bicipital groove. ROM is full in all planes. Rotator cuff strength normal throughout. Mild impingement noted with Neer's and Hawkins. Speeds and Yergason's tests normal. No labral pathology noted with negative Obrien's, negative clunk and good stability. Normal scapular function observed. No painful arc and no drop arm sign. No apprehension sign Contralateral shoulder unremarkable  Foot exam shows the patient does have mild overpronation of the hindfoot. Patient does have rigidity of the first metatarsal on the right foot with no rigidity on the contralateral side. Mild positive squeeze with minimal tenderness between the first and second toe. Neurovascular intact distally with good capillary refill. Angulation shows no abnormality in gait.      Impression and Recommendations:     This case required medical decision making of moderate complexity.      Note: This dictation was prepared with Dragon dictation along with smaller phrase technology. Any transcriptional errors that result from this process are unintentional.

## 2015-02-24 NOTE — Progress Notes (Signed)
Pre visit review using our clinic review tool, if applicable. No additional management support is needed unless otherwise documented below in the visit note. 

## 2015-02-24 NOTE — Assessment & Plan Note (Signed)
Right-sided and mild at this time. We will try changing shoes, over-the-counter orthotics, but feel that any exercises are necessary. If worsening symptoms we can always try injection but I do not think this will be likely.

## 2015-03-24 ENCOUNTER — Ambulatory Visit: Payer: BC Managed Care – PPO | Admitting: Family Medicine

## 2015-04-14 ENCOUNTER — Ambulatory Visit (INDEPENDENT_AMBULATORY_CARE_PROVIDER_SITE_OTHER): Payer: BC Managed Care – PPO | Admitting: Family Medicine

## 2015-04-14 ENCOUNTER — Other Ambulatory Visit (INDEPENDENT_AMBULATORY_CARE_PROVIDER_SITE_OTHER): Payer: BC Managed Care – PPO

## 2015-04-14 ENCOUNTER — Encounter: Payer: Self-pay | Admitting: Family Medicine

## 2015-04-14 VITALS — BP 116/80 | HR 72 | Wt 165.0 lb

## 2015-04-14 DIAGNOSIS — M2021 Hallux rigidus, right foot: Secondary | ICD-10-CM | POA: Diagnosis not present

## 2015-04-14 DIAGNOSIS — M75102 Unspecified rotator cuff tear or rupture of left shoulder, not specified as traumatic: Secondary | ICD-10-CM

## 2015-04-14 DIAGNOSIS — M7552 Bursitis of left shoulder: Secondary | ICD-10-CM

## 2015-04-14 NOTE — Patient Instructions (Addendum)
I am so happy to hear that the foot is doing great  I like the idea of [place] Ice is your friend after a lot of activity  Try to decrease turmeric and see what you think Glucosamine slows progression of arthritis Shoulder exercises 3 times a week for 6 weeks See me again in 6 weeks if shoulder is bugging you.

## 2015-04-14 NOTE — Assessment & Plan Note (Signed)
Mild in nature. I think it's likely more degenerative than acute. Patient likely will do well with conservative therapy. Home exercises given. We discussed icing regimen again in greater detail. Patient declined formal physical therapy. We'll see her back again in 6 weeks to make sure that this has resolved. Otherwise patient would be a candidate for injection and formal physical therapy.  Spent  25 minutes with patient face-to-face and had greater than 50% of counseling including as described above in assessment and plan.

## 2015-04-14 NOTE — Progress Notes (Signed)
Catherine Bell D.O. [facility] [address] [place], [place] [address] Phone: [phone] Subjective:    I'm seeing this patient by the request  of:  MCNEILL,WENDY, MD   CC: Left shoulder pain and right foot pain  [id number]:[id number]:Subjective Catherine Bell is a 65 y.o. female coming in with complaint of left shoulder pain and right foot pain. Regarding patient's left shoulder She was found to have some mild bursitis of the left shoulder. Patient will try conservative therapy. An states that overall she seems to be doing relatively well. Still has some mild impingement-like signs. States that when she makes certain movement gives her a sharp pain. Nothing that seems to be severe though. No weakness. Patient would state that she is only about 10% better.  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 States since she's change shoes intact some the vitamins she is feeling 100% better. Very minimal discomfort from time to time. Nothing that seems to be severe. Very happy with the results.     Past Medical History  Diagnosis Date  . Allergy   . PONV (postoperative nausea and vomiting)    Past Surgical History  Procedure Laterality Date  . Tubal ligation    . Colonscopy     . Cholecystectomy N/A 01/27/2014    Procedure: LAPAROSCOPIC CHOLECYSTECTOMY WITH INTRAOPERATIVE CHOLANGIOGRAM;  Surgeon: Avel Peaceodd Rosenbower, MD;  Location: [facility];  Service: General;  Laterality: N/A;   Social History   Social History  . Marital Status: Married    Spouse Name: N/A  . Number of Children: N/A  . Years of Education: N/A   Social History Main Topics  . Smoking status: Never Smoker   . Smokeless tobacco: Never Used  . Alcohol Use: Yes     Comment: occas beer  . Drug Use: No  . Sexual Activity: Not Asked   Other Topics Concern  . None   Social History Narrative   Allergies  Allergen Reactions  . Advil [Ibuprofen] Nausea And Vomiting    Can only take 1 tab anything more will  irritate stomach.   . Vitamin C Nausea And Vomiting    "upsets stomach"   Family History  Problem Relation Age of Onset  . Colon cancer Neg Hx   . Diabetes Mother   . Heart disease Mother   . Lung cancer Father   . Uterine cancer Sister     Past medical history, social, surgical and family history all reviewed in electronic medical record.  No pertanent information unless stated regarding to the chief complaint.   Review of Systems: No headache, visual changes, nausea, vomiting, diarrhea, constipation, dizziness, abdominal pain, skin rash, fevers, chills, night sweats, weight loss, swollen lymph nodes, body aches, joint swelling, muscle aches, chest pain, shortness of breath, mood changes.   Objective Blood pressure 116/80, pulse 72, weight 165 lb (74.844 kg).  General: No apparent distress alert and oriented x3 mood and affect normal, dressed appropriately.  HEENT: Pupils equal, extraocular movements intact  Respiratory: Patient's speak in full sentences and does not appear short of breath  Cardiovascular: No lower extremity edema, non tender, no erythema  Skin: Warm dry intact with no signs of infection or rash on extremities or on axial skeleton.  Abdomen: Soft nontender  Neuro: Cranial nerves II through XII are intact, neurovascularly intact in all extremities with 2+ DTRs and 2+ pulses.  Lymph: No lymphadenopathy of posterior or anterior cervical chain or axillae bilaterally.  Gait normal  with good balance and coordination.  MSK:  Non tender with full range of motion and good stability and symmetric strength and tone of  elbows, wrist, hip, knee and ankles bilaterally.  Shoulder: Left Inspection reveals no abnormalities, atrophy or asymmetry. Palpation is normal with no tenderness over AC joint or bicipital groove. ROM is full in all planes. Rotator cuff strength normal throughout. Mild impingement noted with Neer's and Hawkins. Speeds and Yergason's tests normal. No labral  pathology noted with negative Obrien's, negative clunk and good stability. Normal scapular function observed. No painful arc and no drop arm sign. No apprehension sign Contralateral shoulder unremarkable No significant change from previous exam  Foot exam shows the patient does have mild overpronation of the hindfoot. Patient does have rigidity of the first metatarsal on the right foot with no rigidity on the contralateral side. nontender on exam today. Negative squeeze test.  MSK US performed of: left shoulder This study was ordered, performed, and interpreted by Terrilee Files D.O.  Shoulder:   Supraspinatus:  Patient does have some intersubstance tearing of the supraspinatus tendon. No significant retraction. Mild increase in Doppler flow as well as bursitis noted Infraspinatus:  Appears normal on long and transverse views. Subscapularis:  Appears normal on long and transverse views. Teres Minor:  Appears normal on long and transverse views. AC joint:  Mild arthritic changes Glenohumeral Joint:  Appears normal without effusion. Glenoid Labrum:  Intact without visualized tears. Biceps Tendon:  Appears normal on long and transverse views, no fraying of tendon, tendon located in intertubercular groove, no subluxation with shoulder internal or external rotation. No increased power doppler signal. Impression: Rotator cuff tear that appears degenerative and mild in nature.    Impression and Recommendations:     This case required medical decision making of moderate complexity.      Note: This dictation was prepared with Dragon dictation along with smaller phrase technology. Any transcriptional errors that result from this process are unintentional.

## 2015-04-14 NOTE — Assessment & Plan Note (Signed)
Overall patient has made improvement. Encourage her to continue the conservative therapy. Given more trial of topical anti-inflammatory's. Discussed icing. Patient come back and see me again in 6 weeks to make sure she continues to do well.

## 2015-05-28 ENCOUNTER — Ambulatory Visit: Payer: BC Managed Care – PPO | Admitting: Family Medicine

## 2015-06-28 ENCOUNTER — Encounter: Payer: Self-pay | Admitting: Family Medicine

## 2015-06-28 ENCOUNTER — Other Ambulatory Visit: Payer: Self-pay

## 2015-06-28 ENCOUNTER — Ambulatory Visit (INDEPENDENT_AMBULATORY_CARE_PROVIDER_SITE_OTHER): Payer: BC Managed Care – PPO | Admitting: Family Medicine

## 2015-06-28 VITALS — BP 104/72 | HR 69 | Ht 68.0 in | Wt 165.0 lb

## 2015-06-28 DIAGNOSIS — S8262XA Displaced fracture of lateral malleolus of left fibula, initial encounter for closed fracture: Secondary | ICD-10-CM

## 2015-06-28 DIAGNOSIS — M79672 Pain in left foot: Secondary | ICD-10-CM | POA: Diagnosis not present

## 2015-06-28 MED ORDER — VITAMIN D (ERGOCALCIFEROL) 1.25 MG (50000 UNIT) PO CAPS: 50000.0000 [IU] | ORAL_CAPSULE | ORAL | Status: DC

## 2015-06-28 NOTE — Patient Instructions (Signed)
Good to see you  You do have a small fracture on the side of your ankle Wear brace with walking another 2 weeks at least then come out of it a little more.  Try biking a little more than walking all the time.  Ice 20 minutes 2 times daily. Usually after activity and before bed. Once weekly vitamin D for next 4 weeks then back to daily  See me again in 3 weeks to make sure it is healed.

## 2015-06-28 NOTE — Assessment & Plan Note (Signed)
Patient does have an avulsion fracture but does have some healing noted. We will do once weekly vitamin D for the next 4 weeks to try to help. Patient will continue bracing. Patient given home exercises. Decline formal physical therapy. We discussed icing regimen. Discussed proper shoes. Patient will follow-up with me again in 2 weeks. We'll make sure that the bone is near completely healed at that time and return patient back to her regular walking regimen.

## 2015-06-28 NOTE — Progress Notes (Signed)
Pre visit review using our clinic review tool, if applicable. No additional management support is needed unless otherwise documented below in the visit note. 

## 2015-06-28 NOTE — Progress Notes (Signed)
Catherine ScaleZach Smith D.O. [facility] [address] [place], [place] [address] Phone: [phone] Subjective:    CC: Left ankle pain  BJY:NWGNFAOZHYHPI:Subjective Catherine Bell is a 65 y.o. female coming in with complaint of Left ankle pain. Patient states 3 weeks ago she did roll her ankle and fell. Patient states that the next day she had significant swelling and bruising. States that she has some mild difficulty with ambulation initially. States that over the course of week so it has improved. Is able family on him. Bruising and swelling has completely resolved. Patient has noticed some mild tightness though. Patient is concerned that this tightness we'll not go away and is looking for some direction in physical therapy. Still some discomfort when going up or downstairs. Denies any locking or giving out on her. Discomfort is 3 out of 10.     Past Medical History  Diagnosis Date  . Allergy   . PONV (postoperative nausea and vomiting)    Past Surgical History  Procedure Laterality Date  . Tubal ligation    . Colonscopy     . Cholecystectomy N/A 01/27/2014    Procedure: LAPAROSCOPIC CHOLECYSTECTOMY WITH INTRAOPERATIVE CHOLANGIOGRAM;  Surgeon: Avel Peaceodd Rosenbower, MD;  Location: [facility];  Service: General;  Laterality: N/A;   Social History   Social History  . Marital Status: Married    Spouse Name: N/A  . Number of Children: N/A  . Years of Education: N/A   Social History Main Topics  . Smoking status: Never Smoker   . Smokeless tobacco: Never Used  . Alcohol Use: Yes     Comment: occas beer  . Drug Use: No  . Sexual Activity: Not Asked   Other Topics Concern  . None   Social History Narrative   Allergies  Allergen Reactions  . Advil [Ibuprofen] Nausea And Vomiting    Can only take 1 tab anything more will irritate stomach.   . Vitamin C Nausea And Vomiting    "upsets stomach"   Family History  Problem Relation Age of Onset  . Colon cancer Neg Hx   . Diabetes Mother   .  Heart disease Mother   . Lung cancer Father   . Uterine cancer Sister     Past medical history, social, surgical and family history all reviewed in electronic medical record.  No pertanent information unless stated regarding to the chief complaint.   Review of Systems: No headache, visual changes, nausea, vomiting, diarrhea, constipation, dizziness, abdominal pain, skin rash, fevers, chills, night sweats, weight loss, swollen lymph nodes, body aches, joint swelling, muscle aches, chest pain, shortness of breath, mood changes.   Objective Blood pressure 104/72, pulse 69, height 5\' 8"  (1.727 m), weight 165 lb (74.844 kg), SpO2 97 %.  General: No apparent distress alert and oriented x3 mood and affect normal, dressed appropriately.  HEENT: Pupils equal, extraocular movements intact  Respiratory: Patient's speak in full sentences and does not appear short of breath  Cardiovascular: No lower extremity edema, non tender, no erythema  Skin: Warm dry intact with no signs of infection or rash on extremities or on axial skeleton.  Abdomen: Soft nontender  Neuro: Cranial nerves II through XII are intact, neurovascularly intact in all extremities with 2+ DTRs and 2+ pulses.  Lymph: No lymphadenopathy of posterior or anterior cervical chain or axillae bilaterally.  Gait normal with good balance and coordination.  MSK:  Non tender with full range of motion and good stability and symmetric strength and tone  of shoulders, elbows, wrist, hip, knee and bilaterally.  Ankle: Left Trace swelling on the lateral aspect the ankle Mild tightness in an eversion and inversion Strength is 5/5 in all directions. Stable lateral and medial ligaments; squeeze test and kleiger test unremarkable; Talar dome nontender; No pain at base of 5th MT; No tenderness over cuboid; No tenderness over N spot or navicular prominence Tender over the inferior lateral malleolus No sign of peroneal tendon subluxations or tenderness to  palpation Negative tarsal tunnel tinel's Able to walk 4 steps. Contralateral ankle unremarkable  MSK US performed of: Left ankle This study was ordered, performed, and interpreted by Terrilee Files D.O.  Foot/Ankle:   All structures visualized.   Talar dome unremarkable  Ankle mortise trace effusion Patient's lateral malleolus does have an avulsion fracture noted. Callus formation noted some improvement but only 50%. Minimal this placement.  IMPRESSION:  Left lateral avulsion fracture of the fibula with minimal this placement. Callus formation noted.     Impression and Recommendations:     This case required medical decision making of moderate complexity.      Note: This dictation was prepared with Dragon dictation along with smaller phrase technology. Any transcriptional errors that result from this process are unintentional.

## 2015-07-19 ENCOUNTER — Encounter: Payer: Self-pay | Admitting: Family Medicine

## 2015-07-19 ENCOUNTER — Other Ambulatory Visit: Payer: Self-pay

## 2015-07-19 ENCOUNTER — Ambulatory Visit (INDEPENDENT_AMBULATORY_CARE_PROVIDER_SITE_OTHER): Payer: BC Managed Care – PPO | Admitting: Family Medicine

## 2015-07-19 VITALS — BP 94/70 | HR 76 | Ht 68.0 in | Wt 165.0 lb

## 2015-07-19 DIAGNOSIS — S8262XG Displaced fracture of lateral malleolus of left fibula, subsequent encounter for closed fracture with delayed healing: Secondary | ICD-10-CM

## 2015-07-19 DIAGNOSIS — M25572 Pain in left ankle and joints of left foot: Secondary | ICD-10-CM

## 2015-07-19 MED ORDER — VITAMIN D (ERGOCALCIFEROL) 1.25 MG (50000 UNIT) PO CAPS: 50000.0000 [IU] | ORAL_CAPSULE | ORAL | Status: DC

## 2015-07-19 NOTE — Progress Notes (Signed)
Pre visit review using our clinic review tool, if applicable. No additional management support is needed unless otherwise documented below in the visit note. 

## 2015-07-19 NOTE — Assessment & Plan Note (Signed)
Patient is showing some healing but not significant enough. Encourage her to wear the brace on a more regular basis. Patient did get a refill of the once weekly vitamin D supplementation for another 4 weeks. We discussed about different treatment options but at this time will continue to monitor. Patient was to continue this and decline formal physical therapy. We will see patient back again in 4 weeks for further evaluation and treatment. Spent  25 minutes with patient face-to-face and had greater than 50% of counseling including as described above in assessment and plan.

## 2015-07-19 NOTE — Patient Instructions (Addendum)
Good to see you  Ice is your friend I would do 2 times a dya again Wear the brace more religiously with activity for the next 2 weeks Refilled the vitamin D Arnica lotion to the knee can help some.  See me again in 4 weeks to make sure making progress

## 2015-07-19 NOTE — Progress Notes (Signed)
Catherine ScaleZach Luismanuel Bell D.O. [facility] [address] [place], [place] [address] Phone: [phone] Subjective:    CC: Left ankle pain Follow-up  OZH:[id number]:Subjective Catherine Bell Catherine Bell is a 65 y.o. female coming in with complaint of Left ankle pain. Patient was found to have more of a lateral malleolus fracture. Seem to be more mid avulsion. States that she is making progress. Not wearing the brace very frequently but continues to have some mild pain as well as some swelling on the lateral aspect of the ankle. Nothing that is stopping her from activity but when she does do a significant amount walking she does notice the discomfort.      Past Medical History  Diagnosis Date  . Allergy   . PONV (postoperative nausea and vomiting)    Past Surgical History  Procedure Laterality Date  . Tubal ligation    . Colonscopy     . Cholecystectomy N/A 01/27/2014    Procedure: LAPAROSCOPIC CHOLECYSTECTOMY WITH INTRAOPERATIVE CHOLANGIOGRAM;  Surgeon: Catherine Peaceodd Rosenbower, MD;  Location: [facility];  Service: General;  Laterality: N/A;   Social History   Social History  . Marital Status: Married    Spouse Name: N/A  . Number of Children: N/A  . Years of Education: N/A   Social History Main Topics  . Smoking status: Never Smoker   . Smokeless tobacco: Never Used  . Alcohol Use: Yes     Comment: occas beer  . Drug Use: No  . Sexual Activity: Not on file   Other Topics Concern  . Not on file   Social History Narrative   Allergies  Allergen Reactions  . Advil [Ibuprofen] Nausea And Vomiting    Can only take 1 tab anything more will irritate stomach.   . Vitamin C Nausea And Vomiting    "upsets stomach"   Family History  Problem Relation Age of Onset  . Colon cancer Neg Hx   . Diabetes Mother   . Heart disease Mother   . Lung cancer Father   . Uterine cancer Sister     Past medical history, social, surgical and family history all reviewed in electronic medical record.  No pertanent  information unless stated regarding to the chief complaint.   Review of Systems: No headache, visual changes, nausea, vomiting, diarrhea, constipation, dizziness, abdominal pain, skin rash, fevers, chills, night sweats, weight loss, swollen lymph nodes, body aches, joint swelling, muscle aches, chest pain, shortness of breath, mood changes.   Objective Blood pressure 94/70, pulse 76, height 5\' 8"  (1.727 m), weight 165 lb (74.844 kg), SpO2 96 %.  General: No apparent distress alert and oriented x3 mood and affect normal, dressed appropriately.  HEENT: Pupils equal, extraocular movements intact  Respiratory: Patient's speak in full sentences and does not appear short of breath  Cardiovascular: No lower extremity edema, non tender, no erythema  Skin: Warm dry intact with no signs of infection or rash on extremities or on axial skeleton.  Abdomen: Soft nontender  Neuro: Cranial nerves II through XII are intact, neurovascularly intact in all extremities with 2+ DTRs and 2+ pulses.  Lymph: No lymphadenopathy of posterior or anterior cervical chain or axillae bilaterally.  Gait normal with good balance and coordination.  MSK:  Non tender with full range of motion and good stability and symmetric strength and tone of shoulders, elbows, wrist, hip, knee and bilaterally.  Ankle: Left Continued trace swelling Improved range of motion and is now full Strength is 5/5 in all directions. Stable lateral  and medial ligaments; squeeze test and kleiger test unremarkable; Talar dome nontender; No pain at base of 5th MT; No tenderness over cuboid; No tenderness over N spot or navicular prominence Tender over the inferior lateral malleolus still present No sign of peroneal tendon subluxations or tenderness to palpation Negative tarsal tunnel tinel's Able to walk 4 steps. Contralateral ankle unremarkable  MSK US performed of: Left ankle This study was ordered, performed, and interpreted by Catherine FilesZach Genasis Bell  D.O.  Foot/Ankle:   All structures visualized.   Talar dome unremarkable  Ankle mortise trace effusion Patient's lateral malleolus does have an avulsion fracture noted. This does not appear to be displaced anymore. Patient does have what appears to be either we injection of the bone or erosive changes. Mild hypoechoic changes and increasing Doppler flow was noted.  IMPRESSION:  Left lateral avulsion fracture of the fibula with no displacement with erosive changes that are likely secondary to healing.     Impression and Recommendations:     This case required medical decision making of moderate complexity.      Note: This dictation was prepared with Dragon dictation along with smaller phrase technology. Any transcriptional errors that result from this process are unintentional.

## 2015-08-19 NOTE — Progress Notes (Signed)
[facility] [address] [place], [place] [address] Phone: [phone] Subjective:    CC: Left ankle pain Follow-up  OZH:[id number]  Catherine Bell is a 65 y.o. female coming in with complaint of Left ankle pain. Patient was found to have more of a lateral malleolus fracture. Seem to be more mid avulsion. Patient had what appeared to be more of a nonhealing fracture. Was put on once weekly vitamin D supplementation and was going to do the home exercises on a more regular basis. Patient states she is making improvement. Concerned though because she continues to be osteopenia on her bone scan. Does not feel like she is made significant impairment at this time.    Past Medical History:  Diagnosis Date  . Allergy   . PONV (postoperative nausea and vomiting)    Past Surgical History:  Procedure Laterality Date  . CHOLECYSTECTOMY N/A 01/27/2014   Procedure: LAPAROSCOPIC CHOLECYSTECTOMY WITH INTRAOPERATIVE CHOLANGIOGRAM;  Surgeon: Avel Peace, MD;  Location: [facility];  Service: General;  Laterality: N/A;  . colonscopy     . TUBAL LIGATION     Social History   Social History  . Marital status: Married    Spouse name: N/A  . Number of children: N/A  . Years of education: N/A   Social History Main Topics  . Smoking status: Never Smoker  . Smokeless tobacco: Never Used  . Alcohol use Yes     Comment: occas beer  . Drug use: No  . Sexual activity: Not on file   Other Topics Concern  . Not on file   Social History Narrative  . No narrative on file   Allergies  Allergen Reactions  . Advil [Ibuprofen] Nausea And Vomiting    Can only take 1 tab anything more will irritate stomach.   . Vitamin C Nausea And Vomiting    "upsets stomach"   Family History  Problem Relation Age of Onset  . Colon cancer Neg Hx   . Diabetes Mother   . Heart disease Mother   . Lung cancer Father   . Uterine cancer Sister     Past medical history, social, surgical  and family history all reviewed in electronic medical record.  No pertanent information unless stated regarding to the chief complaint.   Review of Systems: No headache, visual changes, nausea, vomiting, diarrhea, constipation, dizziness, abdominal pain, skin rash, fevers, chills, night sweats, weight loss, swollen lymph nodes, body aches, joint swelling, muscle aches, chest pain, shortness of breath, mood changes.   Objective  There were no vitals taken for this visit.  General: No apparent distress alert and oriented x3 mood and affect normal, dressed appropriately.  HEENT: Pupils equal, extraocular movements intact  Respiratory: Patient's speak in full sentences and does not appear short of breath  Cardiovascular: No lower extremity edema, non tender, no erythema  Skin: Warm dry intact with no signs of infection or rash on extremities or on axial skeleton.  Abdomen: Soft nontender  Neuro: Cranial nerves II through XII are intact, neurovascularly intact in all extremities with 2+ DTRs and 2+ pulses.  Lymph: No lymphadenopathy of posterior or anterior cervical chain or axillae bilaterally.  Gait normal with good balance and coordination.  MSK:  Non tender with full range of motion and good stability and symmetric strength and tone of shoulders, elbows, wrist, hip, knee and bilaterally.  Ankle: Left No swelling Full range of motion Strength is 5/5 in all directions. Stable lateral and  medial ligaments; squeeze test and kleiger test unremarkable; Talar dome nontender; No pain at base of 5th MT; No tenderness over cuboid; No tenderness over N spot or navicular prominence Minimal tenderness over the inferior lateral malleolus this is improved No sign of peroneal tendon subluxations or tenderness to palpation Negative tarsal tunnel tinel's Able to walk 4 steps. Contralateral ankle unremarkable  MSK US performed of: Left ankle This study was ordered, performed, and interpreted by Terrilee Files  D.O.  Foot/Ankle:   All structures visualized.   Talar dome unremarkable  Ankle mortise trace effusion Patient's lateral malleolus does have an avulsion fracture noted.mild callus formation noted. Still seems to be delayed based on the interval since last exam   IMPRESSION:  Delayed lateral avulsion fracture healing    Impression and Recommendations:     This case required medical decision making of moderate complexity.      Note: This dictation was prepared with Dragon dictation along with smaller phrase technology. Any transcriptional errors that result from this process are unintentional.

## 2015-08-20 ENCOUNTER — Encounter: Payer: Self-pay | Admitting: Family Medicine

## 2015-08-20 ENCOUNTER — Ambulatory Visit (INDEPENDENT_AMBULATORY_CARE_PROVIDER_SITE_OTHER): Payer: BC Managed Care – PPO | Admitting: Family Medicine

## 2015-08-20 DIAGNOSIS — S8262XG Displaced fracture of lateral malleolus of left fibula, subsequent encounter for closed fracture with delayed healing: Secondary | ICD-10-CM | POA: Diagnosis not present

## 2015-08-20 DIAGNOSIS — M8580 Other specified disorders of bone density and structure, unspecified site: Secondary | ICD-10-CM | POA: Diagnosis not present

## 2015-08-20 MED ORDER — VITAMIN D (ERGOCALCIFEROL) 1.25 MG (50000 UNIT) PO CAPS: 50000.0000 [IU] | ORAL_CAPSULE | ORAL | 0 refills | Status: DC

## 2015-08-20 MED ORDER — ALENDRONATE SODIUM 70 MG PO TABS: 70.0000 mg | ORAL_TABLET | ORAL | 11 refills | Status: DC

## 2015-08-20 NOTE — Patient Instructions (Signed)
Good to see you  You are making progress Lets try fosamax for 4 weeks. Take with a full glass a water in the morning and stay upright Another round of the vitamin D' See me again in 4 weeks.

## 2015-08-20 NOTE — Assessment & Plan Note (Signed)
Patient has made some improvement but still has slow healing. Patient has elected to try Fosamax with patient also having osteopenia. We discussed icing regimen and continuing the brace with hiking. Follow-up will more time in 3-4 weeks for further evaluation and treatment.

## 2015-09-14 ENCOUNTER — Encounter: Payer: Self-pay | Admitting: Pediatrics

## 2015-09-17 ENCOUNTER — Ambulatory Visit: Payer: BC Managed Care – PPO | Admitting: Family Medicine

## 2015-10-06 NOTE — Progress Notes (Signed)
Catherine Bell D.O. [facility] [address] [place], [place] [address] Phone: [phone] Subjective:    CC: Left ankle pain Follow-up  MWN:[id number]:Subjective  Catherine Bell is a 65 y.o. female coming in with complaint of Left ankle pain. Patient was found to have more of a lateral malleolus fracture. Seem to be more mid avulsion. Patient had what appeared to be more of a nonhealing fracture. Was put on once weekly vitamin D supplementation and was going to do the home exercises on a more regular basis. Patient states she is making improvement. Follow-up did not seem to be making improvement. Patient was given a different brace, we discussed over-the-counter insoles. So was started on Fosamax with a delayed healing. Patient states that she is feeling significantly better. Has been able to even do 17,000 steps in the day with no significant pain with very minimal swelling occasionally. No new symptoms.     Past Medical History:  Diagnosis Date  . Allergy   . PONV (postoperative nausea and vomiting)    Past Surgical History:  Procedure Laterality Date  . CHOLECYSTECTOMY N/A 01/27/2014   Procedure: LAPAROSCOPIC CHOLECYSTECTOMY WITH INTRAOPERATIVE CHOLANGIOGRAM;  Surgeon: Avel Peaceodd Rosenbower, MD;  Location: [facility];  Service: General;  Laterality: N/A;  . colonscopy     . TUBAL LIGATION     Social History   Social History  . Marital status: Married    Spouse name: N/A  . Number of children: N/A  . Years of education: N/A   Social History Main Topics  . Smoking status: Never Smoker  . Smokeless tobacco: Never Used  . Alcohol use Yes     Comment: occas beer  . Drug use: No  . Sexual activity: Not on file   Other Topics Concern  . Not on file   Social History Narrative  . No narrative on file   Allergies  Allergen Reactions  . Advil [Ibuprofen] Nausea And Vomiting    Can only take 1 tab anything more will irritate stomach.   . Vitamin C Nausea And Vomiting    "upsets  stomach"   Family History  Problem Relation Age of Onset  . Colon cancer Neg Hx   . Diabetes Mother   . Heart disease Mother   . Lung cancer Father   . Uterine cancer Sister     Past medical history, social, surgical and family history all reviewed in electronic medical record.  No pertanent information unless stated regarding to the chief complaint.   Review of Systems: No headache, visual changes, nausea, vomiting, diarrhea, constipation, dizziness, abdominal pain, skin rash, fevers, chills, night sweats, weight loss, swollen lymph nodes, body aches, joint swelling, muscle aches, chest pain, shortness of breath, mood changes.   Objective  There were no vitals taken for this visit.  General: No apparent distress alert and oriented x3 mood and affect normal, dressed appropriately.  HEENT: Pupils equal, extraocular movements intact  Respiratory: Patient's speak in full sentences and does not appear short of breath  Cardiovascular: No lower extremity edema, non tender, no erythema  Skin: Warm dry intact with no signs of infection or rash on extremities or on axial skeleton.  Abdomen: Soft nontender  Neuro: Cranial nerves II through XII are intact, neurovascularly intact in all extremities with 2+ DTRs and 2+ pulses.  Lymph: No lymphadenopathy of posterior or anterior cervical chain or axillae bilaterally.  Gait normal with good balance and coordination.  MSK:  Non tender with full range of  motion and good stability and symmetric strength and tone of shoulders, elbows, wrist, hip, knee and bilaterally.  Ankle: Left No swelling Full range of motion Strength is 5/5 in all directions. Stable lateral and medial ligaments; squeeze test and kleiger test unremarkable; Talar dome nontender; No pain at base of 5th MT; No tenderness over cuboid; No tenderness over N spot or navicular prominence Minimal tenderness over the inferior lateral malleolus this is improved No sign of peroneal tendon  subluxations or tenderness to palpation Negative tarsal tunnel tinel's Able to walk 4 steps. Contralateral ankle unremarkable  MSK US performed of: Left ankle This study was ordered, performed, and interpreted by Terrilee Files D.O.  Foot/Ankle:   All structures visualized.   Talar dome unremarkable  Ankle mortise normal Healed lateral malleolus when incidental finding of a peroneal cyst noted.  IMPRESSION:  Healed fracture of the lateral malleolus with peroneal cyst.     Impression and Recommendations:     This case required medical decision making of moderate complexity.      Note: This dictation was prepared with Dragon dictation along with smaller phrase technology. Any transcriptional errors that result from this process are unintentional.

## 2015-10-07 ENCOUNTER — Ambulatory Visit (INDEPENDENT_AMBULATORY_CARE_PROVIDER_SITE_OTHER): Payer: BC Managed Care – PPO | Admitting: Family Medicine

## 2015-10-07 ENCOUNTER — Encounter: Payer: Self-pay | Admitting: Family Medicine

## 2015-10-07 ENCOUNTER — Other Ambulatory Visit: Payer: Self-pay

## 2015-10-07 VITALS — BP 104/66 | HR 80 | Wt 170.0 lb

## 2015-10-07 DIAGNOSIS — S8262XA Displaced fracture of lateral malleolus of left fibula, initial encounter for closed fracture: Secondary | ICD-10-CM

## 2015-10-07 DIAGNOSIS — M2021 Hallux rigidus, right foot: Secondary | ICD-10-CM

## 2015-10-07 NOTE — Assessment & Plan Note (Signed)
Continue over-the-counter insoles and we discussed thin versus slim to fit in other shoes.

## 2015-10-07 NOTE — Assessment & Plan Note (Signed)
At this time. Patient can follow-up on an as-needed basis, we discussed continuing the icing as well as 3 more weeks of the Fosamax and then to discontinue.

## 2015-10-07 NOTE — Patient Instructions (Signed)
Good to see you  Keep doing what you are doing Fosamax another 3 weeks then stop  Ice is your friend when needed now.  Peroneal cyst seen today but will be fine.  See me when you need me.

## 2016-03-13 ENCOUNTER — Other Ambulatory Visit: Payer: Self-pay | Admitting: Family Medicine

## 2016-03-13 DIAGNOSIS — Z1231 Encounter for screening mammogram for malignant neoplasm of breast: Secondary | ICD-10-CM

## 2016-03-20 ENCOUNTER — Ambulatory Visit
Admission: RE | Admit: 2016-03-20 | Discharge: 2016-03-20 | Disposition: A | Discharge status: Home / Self Care | Payer: BC Managed Care – PPO | Source: Ambulatory Visit | Attending: Family Medicine | Admitting: Family Medicine

## 2016-03-20 DIAGNOSIS — Z1231 Encounter for screening mammogram for malignant neoplasm of breast: Secondary | ICD-10-CM

## 2016-03-27 ENCOUNTER — Ambulatory Visit: Payer: BC Managed Care – PPO

## 2016-05-07 IMAGING — US US ABDOMEN COMPLETE
1 series · 14 of 25 positions shown · non-contrast
Comparison: None.

CLINICAL DATA: Postprandial right upper quadrant pain six weeks.

EXAM:
ULTRASOUND ABDOMEN COMPLETE

[Series 1: us abdomen complete · 0.20mm/px · 14 of 78 slices shown]
[im 1/78]
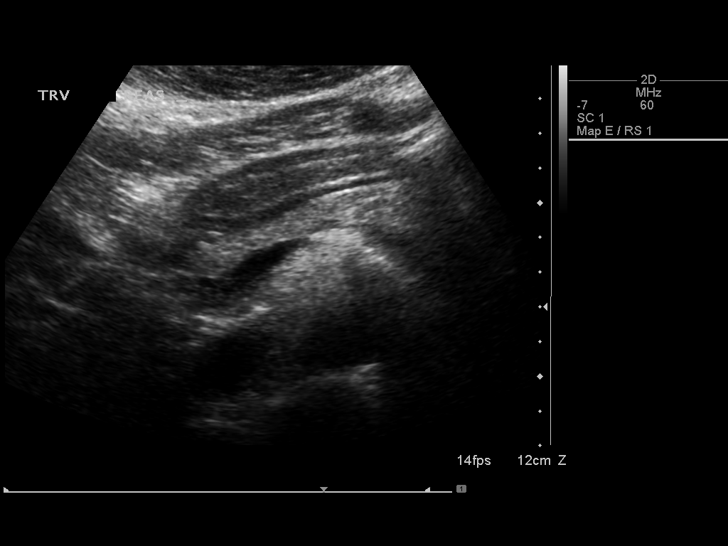
[im 7/78]
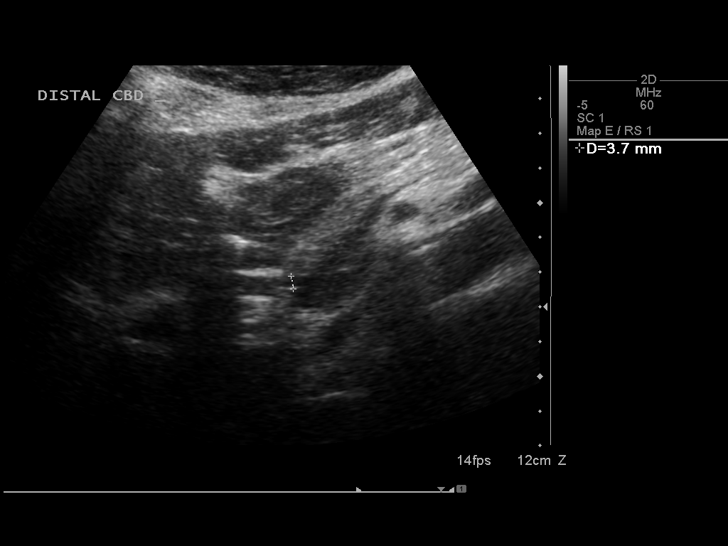
[im 13/78]
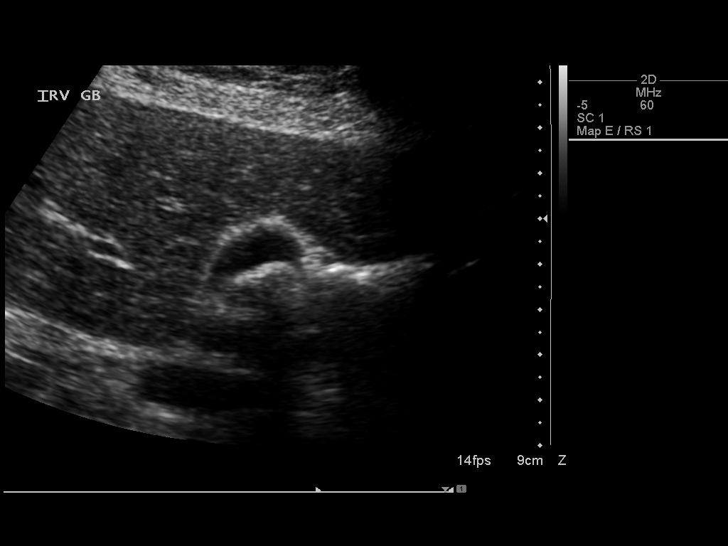
[im 20/78]
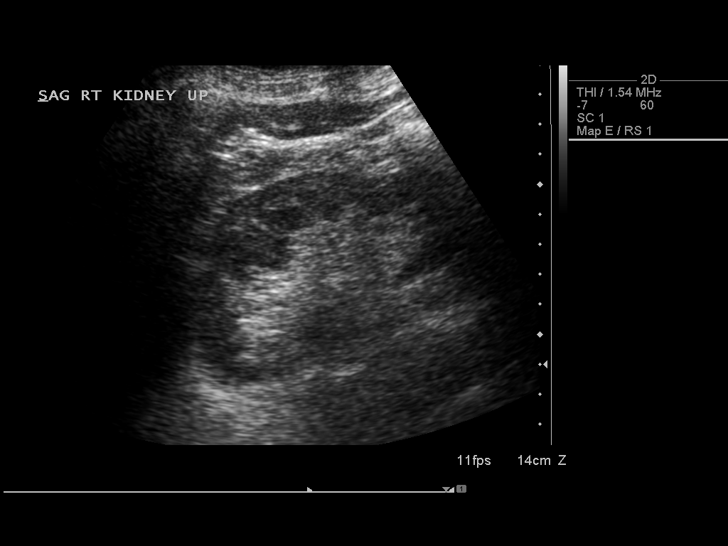
[im 26/78]
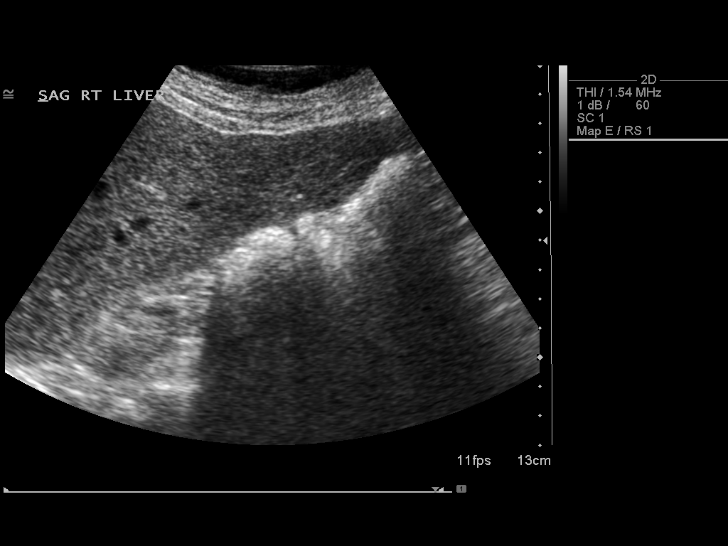
[im 29/78]
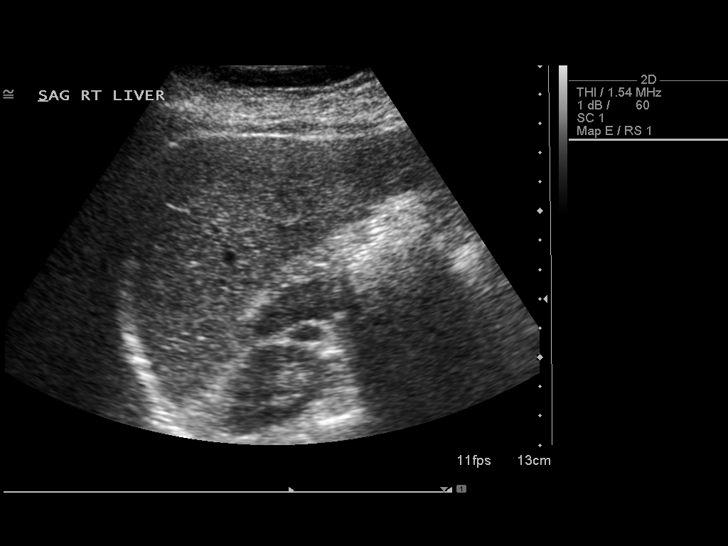
[im 36/78]
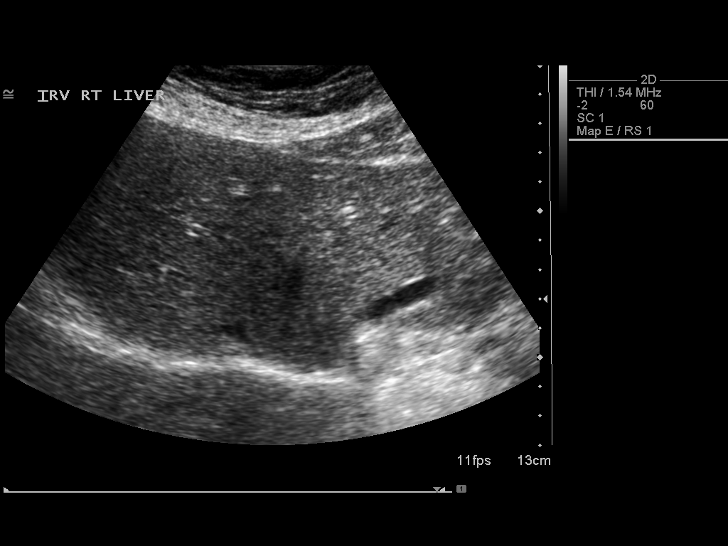
[im 42/78]
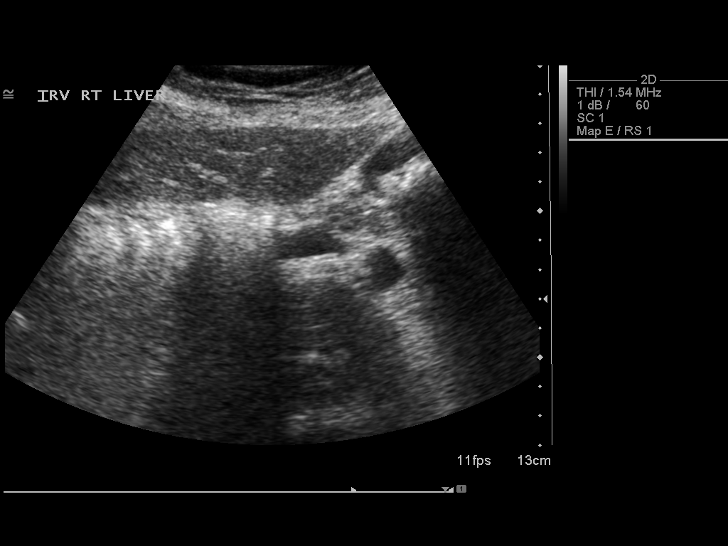
[im 49/78]
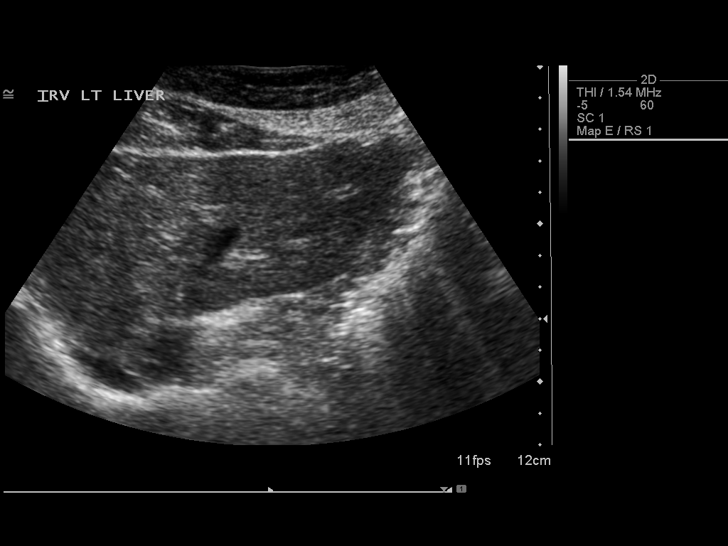
[im 52/78]
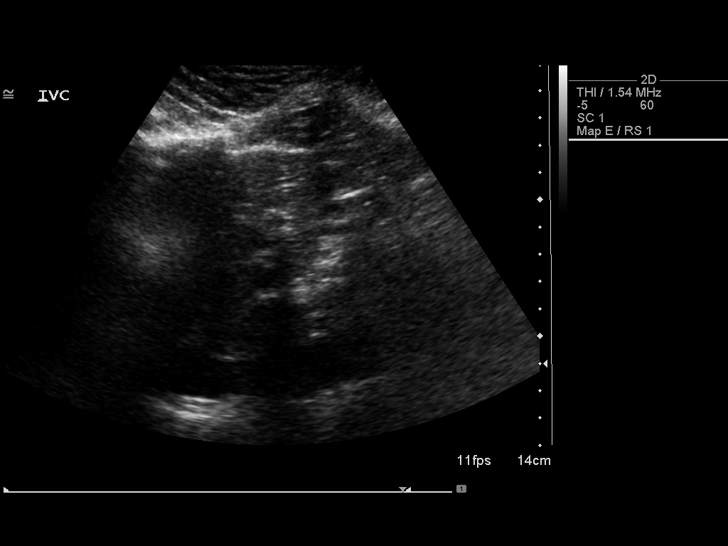
[im 58/78]
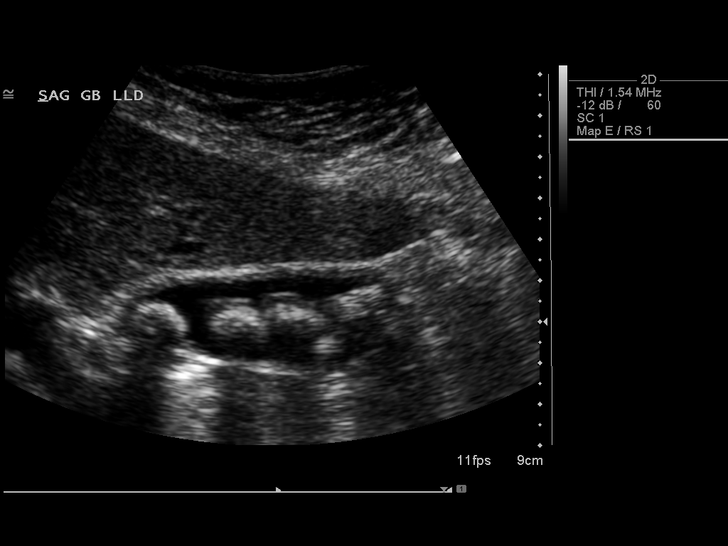
[im 65/78]
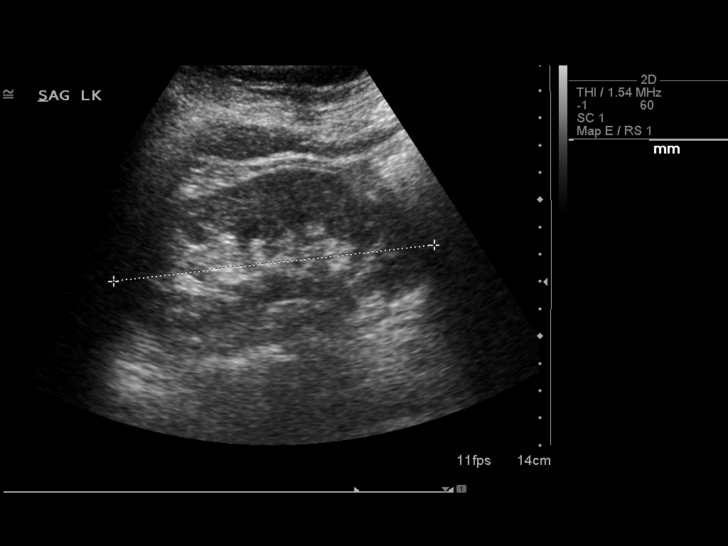
[im 71/78]
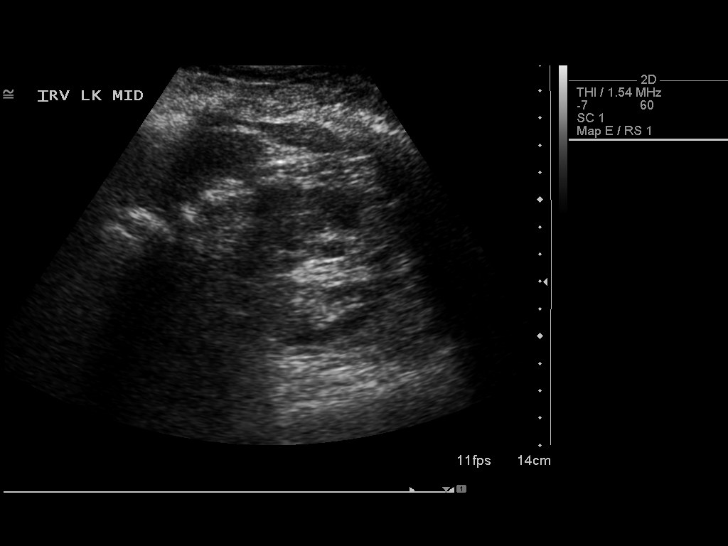
[im 78/78]
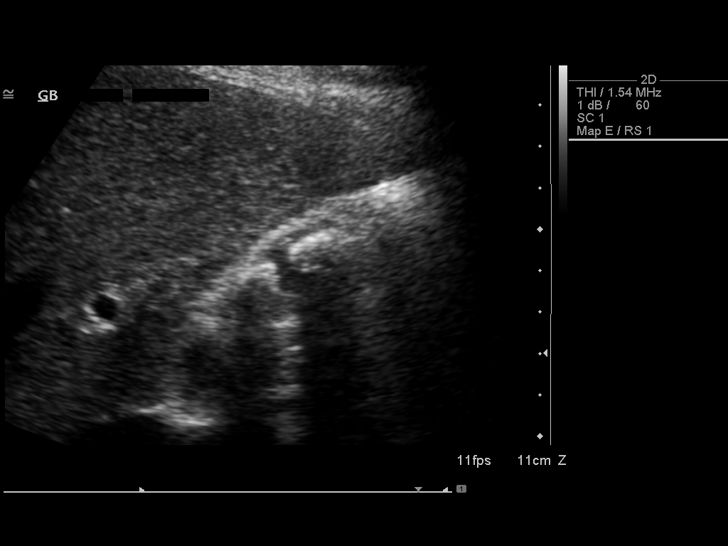

[14 of 25 positions shown; findings below may reference images not displayed]

FINDINGS: Gallbladder: Moderate cholelithiasis with the largest stones
measuring 1.7 cm. No evidence of gallbladder wall thickening.
Negative sonographic Murphy sign.

Common bile duct: Diameter: 3.7 mm.

Liver: No focal lesion identified. Within normal limits in
parenchymal echogenicity.

IVC: No abnormality visualized.

Pancreas: Visualized portion unremarkable.

Spleen: Size and appearance within normal limits.

Right Kidney: Length: 11.0 cm. Echogenicity within normal limits. No
mass or hydronephrosis visualized.

Left Kidney: Length: 11.8 cm. Echogenicity within normal limits. No
mass or hydronephrosis visualized.

Abdominal aorta: No aneurysm visualized.

Other findings: None.
IMPRESSION: Moderate cholelithiasis. No additional sonographic evidence to
suggest cholecystitis.

## 2017-01-08 ENCOUNTER — Other Ambulatory Visit: Payer: Self-pay | Admitting: Family Medicine

## 2017-01-08 ENCOUNTER — Other Ambulatory Visit (HOSPITAL_COMMUNITY)
Admission: RE | Admit: 2017-01-08 | Discharge: 2017-01-08 | Disposition: A | Discharge status: Home / Self Care | Payer: BC Managed Care – PPO | Source: Ambulatory Visit | Attending: Family Medicine | Admitting: Family Medicine

## 2017-01-08 DIAGNOSIS — Z124 Encounter for screening for malignant neoplasm of cervix: Secondary | ICD-10-CM | POA: Insufficient documentation

## 2017-01-09 LAB — CYTOLOGY - PAP: Diagnosis: NEGATIVE

## 2017-02-11 NOTE — Progress Notes (Signed)
[name] [address] [place], [place] [address] Phone: [phone] Subjective:    CC: Foot pain bilateral  BJY:[id number]  Catherine Bell is a 67 y.o. female coming in with complaint of foot pain bilaterally.  Patient has been seen previously and had rigidity of the first metatarsal as well as breakdown of the transverse arch and Morton's neuroma.  Has been 2 years since we have seen patient.  Patient states that her right heel has been bothering her for the past 3 weeks. She has pain mostly in the morning. She has a daughter in law that is a PT who gave her some stretches. She has not been able to do a lot of walking due to the pain.      Past Medical History:  Diagnosis Date  . Allergy   . PONV (postoperative nausea and vomiting)    Past Surgical History:  Procedure Laterality Date  . CHOLECYSTECTOMY N/A 01/27/2014   Procedure: LAPAROSCOPIC CHOLECYSTECTOMY WITH INTRAOPERATIVE CHOLANGIOGRAM;  Surgeon: Avel Peace, MD;  Location: [facility];  Service: General;  Laterality: N/A;  . colonscopy     . TUBAL LIGATION     Social History   Socioeconomic History  . Marital status: Married    Spouse name: None  . Number of children: None  . Years of education: None  . Highest education level: None  Social Needs  . Financial resource strain: None  . Food insecurity - worry: None  . Food insecurity - inability: None  . Transportation needs - medical: None  . Transportation needs - non-medical: None  Occupational History  . None  Tobacco Use  . Smoking status: Never Smoker  . Smokeless tobacco: Never Used  Substance and Sexual Activity  . Alcohol use: Yes    Comment: occas beer  . Drug use: No  . Sexual activity: None  Other Topics Concern  . None  Social History Narrative  . None   Allergies  Allergen Reactions  . Advil [Ibuprofen] Nausea And Vomiting    Can only take 1 tab anything more will irritate stomach.   . Vitamin C Nausea And  Vomiting    "upsets stomach"   Family History  Problem Relation Age of Onset  . Colon cancer Neg Hx   . Diabetes Mother   . Heart disease Mother   . Lung cancer Father   . Uterine cancer Sister      Past medical history, social, surgical and family history all reviewed in electronic medical record.  No pertanent information unless stated regarding to the chief complaint.   Review of Systems:Review of systems updated and as accurate as of 02/12/17  No headache, visual changes, nausea, vomiting, diarrhea, constipation, dizziness, abdominal pain, skin rash, fevers, chills, night sweats, weight loss, swollen lymph nodes, body aches, joint swelling, muscle aches, chest pain, shortness of breath, mood changes.   Objective  Blood pressure 124/78, pulse 60, height 5\' 8"  (1.727 m), weight 174 lb (78.9 kg), SpO2 90 %. Systems examined below as of 02/12/17   General: No apparent distress alert and oriented x3 mood and affect normal, dressed appropriately.  HEENT: Pupils equal, extraocular movements intact  Respiratory: Patient's speak in full sentences and does not appear short of breath  Cardiovascular: No lower extremity edema, non tender, no erythema  Skin: Warm dry intact with no signs of infection or rash on extremities or on axial skeleton.  Abdomen: Soft nontender  Neuro: Cranial nerves II through XII  are intact, neurovascularly intact in all extremities with 2+ DTRs and 2+ pulses.  Lymph: No lymphadenopathy of posterior or anterior cervical chain or axillae bilaterally.  Gait normal with good balance and coordination.  MSK:  Non tender with full range of motion and good stability and symmetric strength and tone of shoulders, elbows, wrist, hip, knee and ankles bilaterally.  Patient's right calcaneal area is severely tender on the plantar aspect.  Patient has no pain over the medial calcaneal area.  Patient's foot is fairly well neutral with mild breakdown the transverse arch   Limited  musculoskeletal ultrasound was performed and interpreted by Judi SaaZachary M Smith  Limited ultrasound of patient's calcaneal area does show that patient did have what appears to be a cortical defect with a soft callus formation noted.  Hypoechoic changes still surrounding the area.  Patient's plantar fascia is within normal limits. Impression: Calcaneal stress fracture   Impression and Recommendations:     This case required medical decision making of moderate complexity.      Note: This dictation was prepared with Dragon dictation along with smaller phrase technology. Any transcriptional errors that result from this process are unintentional.

## 2017-02-12 ENCOUNTER — Ambulatory Visit (INDEPENDENT_AMBULATORY_CARE_PROVIDER_SITE_OTHER): Payer: Medicare Other | Admitting: Family Medicine

## 2017-02-12 ENCOUNTER — Ambulatory Visit: Payer: Self-pay

## 2017-02-12 ENCOUNTER — Encounter: Payer: Self-pay | Admitting: Family Medicine

## 2017-02-12 VITALS — BP 124/78 | HR 60 | Ht 68.0 in | Wt 174.0 lb

## 2017-02-12 DIAGNOSIS — S92001A Unspecified fracture of right calcaneus, initial encounter for closed fracture: Secondary | ICD-10-CM | POA: Diagnosis not present

## 2017-02-12 DIAGNOSIS — M79671 Pain in right foot: Secondary | ICD-10-CM

## 2017-02-12 MED ORDER — VITAMIN D (ERGOCALCIFEROL) 1.25 MG (50000 UNIT) PO CAPS: 50000.0000 [IU] | ORAL_CAPSULE | ORAL | 0 refills | Status: DC

## 2017-02-12 NOTE — Assessment & Plan Note (Signed)
Discussed healed on it, once weekly vitamin D indefinitely with this being her second stress reaction.  We discussed proper shoes again and icing regimen.  Follow-up in 4 weeks

## 2017-02-12 NOTE — Patient Instructions (Signed)
Good to see you  Catherine Bell is your friend. Ice 20 minutes 2 times daily. Usually after activity and before bed. Heel donut Biking or elliptical may be better Once weekly vitamin D Heel donut  See me again in 4 weeks if not great

## 2017-03-12 NOTE — Progress Notes (Signed)
[facility] [address] [place], [place] [address] Phone: [phone] Subjective:      CC: Right heel follow-up  BJY:NWGNFAOZHY  Catherine Bell is a 67 y.o. female coming in with complaint of heel pain.  Patient was found to have a calcaneal stress fracture.  Has been doing the heel donut, avoiding certain activities, taking once weekly vitamin D, would state that she is feeling 70% better but then stepped on a rock and started having a little more increase in pain.  Slowly improving again.       Past Medical History:  Diagnosis Date  . Allergy   . PONV (postoperative nausea and vomiting)    Past Surgical History:  Procedure Laterality Date  . CHOLECYSTECTOMY N/A 01/27/2014   Procedure: LAPAROSCOPIC CHOLECYSTECTOMY WITH INTRAOPERATIVE CHOLANGIOGRAM;  Surgeon: Avel Peace, MD;  Location: [facility];  Service: General;  Laterality: N/A;  . colonscopy     . TUBAL LIGATION     Social History   Socioeconomic History  . Marital status: Married    Spouse name: None  . Number of children: None  . Years of education: None  . Highest education level: None  Social Needs  . Financial resource strain: None  . Food insecurity - worry: None  . Food insecurity - inability: None  . Transportation needs - medical: None  . Transportation needs - non-medical: None  Occupational History  . None  Tobacco Use  . Smoking status: Never Smoker  . Smokeless tobacco: Never Used  Substance and Sexual Activity  . Alcohol use: Yes    Comment: occas beer  . Drug use: No  . Sexual activity: None  Other Topics Concern  . None  Social History Narrative  . None   Allergies  Allergen Reactions  . Advil [Ibuprofen] Nausea And Vomiting    Can only take 1 tab anything more will irritate stomach.   . Vitamin C Nausea And Vomiting    "upsets stomach"   Family History  Problem Relation Age of Onset  . Colon cancer Neg Hx   . Diabetes Mother   . Heart disease  Mother   . Lung cancer Father   . Uterine cancer Sister      Past medical history, social, surgical and family history all reviewed in electronic medical record.  No pertanent information unless stated regarding to the chief complaint.   Review of Systems:Review of systems updated and as accurate as of 03/13/17  No headache, visual changes, nausea, vomiting, diarrhea, constipation, dizziness, abdominal pain, skin rash, fevers, chills, night sweats, weight loss, swollen lymph nodes, body aches, joint swelling, muscle aches, chest pain, shortness of breath, mood changes.   Objective  Blood pressure 110/80, pulse 91, height 5\' 8"  (1.727 m), weight 167 lb (75.8 kg), SpO2 97 %. Systems examined below as of 03/13/17   General: No apparent distress alert and oriented x3 mood and affect normal, dressed appropriately.  HEENT: Pupils equal, extraocular movements intact  Respiratory: Patient's speak in full sentences and does not appear short of breath  Cardiovascular: No lower extremity edema, non tender, no erythema  Skin: Warm dry intact with no signs of infection or rash on extremities or on axial skeleton.  Abdomen: Soft nontender  Neuro: Cranial nerves II through XII are intact, neurovascularly intact in all extremities with 2+ DTRs and 2+ pulses.  Lymph: No lymphadenopathy of posterior or anterior cervical chain or axillae bilaterally.  Gait normal with good balance and  coordination.  MSK:  Non tender with full range of motion and good stability and symmetric strength and tone of shoulders, elbows, wrist, hip, knee and ankles bilaterally.  Mild arthritic changes of multiple joints Foot exam shows some breakdown of the transverse arch on the right side.  Minimal discomfort to palpation over the calcaneal region on the plantar aspect.  Full range of motion of the ankle.  Limited musculoskeletal ultrasound was performed and interpreted by Judi SaaZachary M Andren Bethea  Limited ultrasound of the right  calcaneal region does show callus formation noted with reabsorption occurring.  Mild new contusion of the bone in the area. Impression: Healing calcaneal stress fracture    Impression and Recommendations:     This case required medical decision making of moderate complexity.      Note: This dictation was prepared with Dragon dictation along with smaller phrase technology. Any transcriptional errors that result from this process are unintentional.

## 2017-03-13 ENCOUNTER — Ambulatory Visit: Payer: Self-pay

## 2017-03-13 ENCOUNTER — Other Ambulatory Visit: Payer: Self-pay

## 2017-03-13 ENCOUNTER — Encounter: Payer: Self-pay | Admitting: Family Medicine

## 2017-03-13 ENCOUNTER — Ambulatory Visit (INDEPENDENT_AMBULATORY_CARE_PROVIDER_SITE_OTHER): Payer: Medicare Other | Admitting: Family Medicine

## 2017-03-13 VITALS — BP 110/80 | HR 91 | Ht 68.0 in | Wt 167.0 lb

## 2017-03-13 DIAGNOSIS — M79673 Pain in unspecified foot: Secondary | ICD-10-CM | POA: Diagnosis not present

## 2017-03-13 DIAGNOSIS — S92001D Unspecified fracture of right calcaneus, subsequent encounter for fracture with routine healing: Secondary | ICD-10-CM | POA: Diagnosis not present

## 2017-03-13 MED ORDER — VITAMIN D (ERGOCALCIFEROL) 1.25 MG (50000 UNIT) PO CAPS: 50000.0000 [IU] | ORAL_CAPSULE | ORAL | 0 refills | Status: DC

## 2017-03-13 MED ORDER — VITAMIN D (ERGOCALCIFEROL) 1.25 MG (50000 UNIT) PO CAPS: 50000.0000 [IU] | ORAL_CAPSULE | ORAL | 0 refills | Status: AC

## 2017-03-13 NOTE — Patient Instructions (Signed)
God to see you  Ice is your friend Continue the heel donut another 2 weeks.  [employer] orthotics "total support" online would be great  Should be pain free in 3 week Iron 65mg  (ferrous gluconate) and vitamin C 500mg  3 times a week for 3 weeks may help as well  Refilled vitamin D again  See me one more time in 4-6 weeks

## 2017-03-13 NOTE — Assessment & Plan Note (Signed)
Healing fracture noted.  Discussed icing regimen and home exercises, we discussed which activities to do which wants to avoid.  We discussed certain shoes still.  Patient may need custom orthotics in the long run.  Follow-up again in 6 weeks.

## 2017-04-22 NOTE — Progress Notes (Signed)
[name] [address] [place], [place] [address] Phone: [phone] Subjective:     CC: Heel pain follow-up  BJY:NWGNFAOZHY  Catherine Bell is a 67 y.o. female coming in with complaint of heel pain.  Was found to have a calcaneal stress fracture.  Healing was noted on March 13, 2017.  Patient was to continue with once weekly vitamin D and start to increase activity slowly.  Patient states doing well overall.  Patient though has been in the process of moving.  This is made her do 18-20,000 steps a day.  Noticing some tightness in the morning.  Denies any numbness, any swelling.  States that the severe pain in the heel though is much better.     Past Medical History:  Diagnosis Date  . Allergy   . PONV (postoperative nausea and vomiting)    Past Surgical History:  Procedure Laterality Date  . CHOLECYSTECTOMY N/A 01/27/2014   Procedure: LAPAROSCOPIC CHOLECYSTECTOMY WITH INTRAOPERATIVE CHOLANGIOGRAM;  Surgeon: Avel Peace, MD;  Location: [facility];  Service: General;  Laterality: N/A;  . colonscopy     . TUBAL LIGATION     Social History   Socioeconomic History  . Marital status: Married    Spouse name: Not on file  . Number of children: Not on file  . Years of education: Not on file  . Highest education level: Not on file  Occupational History  . Not on file  Social Needs  . Financial resource strain: Not on file  . Food insecurity:    Worry: Not on file    Inability: Not on file  . Transportation needs:    Medical: Not on file    Non-medical: Not on file  Tobacco Use  . Smoking status: Never Smoker  . Smokeless tobacco: Never Used  Substance and Sexual Activity  . Alcohol use: Yes    Comment: occas beer  . Drug use: No  . Sexual activity: Not on file  Lifestyle  . Physical activity:    Days per week: Not on file    Minutes per session: Not on file  . Stress: Not on file  Relationships  . Social connections:    Talks on  phone: Not on file    Gets together: Not on file    Attends religious service: Not on file    Active member of club or organization: Not on file    Attends meetings of clubs or organizations: Not on file    Relationship status: Not on file  Other Topics Concern  . Not on file  Social History Narrative  . Not on file   Allergies  Allergen Reactions  . Advil [Ibuprofen] Nausea And Vomiting    Can only take 1 tab anything more will irritate stomach.   . Vitamin C Nausea And Vomiting    "upsets stomach"   Family History  Problem Relation Age of Onset  . Colon cancer Neg Hx   . Diabetes Mother   . Heart disease Mother   . Lung cancer Father   . Uterine cancer Sister      Past medical history, social, surgical and family history all reviewed in electronic medical record.  No pertanent information unless stated regarding to the chief complaint.   Review of Systems:Review of systems updated and as accurate as of 04/23/17  No headache, visual changes, nausea, vomiting, diarrhea, constipation, dizziness, abdominal pain, skin rash, fevers, chills, night sweats, weight loss, swollen lymph nodes,  body aches, joint swelling, muscle aches, chest pain, shortness of breath, mood changes.   Objective  Blood pressure 110/62, pulse (!) 45, height 5\' 8"  (1.727 m), weight 167 lb (75.8 kg), SpO2 97 %. Systems examined below as of 04/23/17   General: No apparent distress alert and oriented x3 mood and affect normal, dressed appropriately.  HEENT: Pupils equal, extraocular movements intact  Respiratory: Patient's speak in full sentences and does not appear short of breath  Cardiovascular: No lower extremity edema, non tender, no erythema  Skin: Warm dry intact with no signs of infection or rash on extremities or on axial skeleton.  Abdomen: Soft nontender  Neuro: Cranial nerves II through XII are intact, neurovascularly intact in all extremities with 2+ DTRs and 2+ pulses.  Lymph: No  lymphadenopathy of posterior or anterior cervical chain or axillae bilaterally.  Gait normal with good balance and coordination.  MSK:  Non tender with full range of motion and good stability and symmetric strength and tone of shoulders, elbows, wrist, hip, knee and ankles bilaterally.  Right foot exam shows breakdown of the transverse arch.  Bunion and bunionette formation noted.  Mild tenderness to palpation on the plantar aspect of the heel.  Nothing as severe as what it was previously.  Limited musculoskeletal ultrasound was performed and interpreted by Judi SaaZachary M Smith  Limited ultrasound of patient's right calcaneal area shows that patient stress fracture is completely healed at this time.  Very mild overlying hypoechoic changes from the surrounding area.  No increasing in Doppler flow.  Patient does have some mild thickening of the plantar fascia noted with some mild hypoechoic changes.  No acute tear appreciated. Impression: Healed calcaneal stress fracture with reactive plantar fasciitis, mild    Impression and Recommendations:     This case required medical decision making of moderate complexity.      Note: This dictation was prepared with Dragon dictation along with smaller phrase technology. Any transcriptional errors that result from this process are unintentional.

## 2017-04-23 ENCOUNTER — Ambulatory Visit (INDEPENDENT_AMBULATORY_CARE_PROVIDER_SITE_OTHER): Payer: Medicare Other | Admitting: Family Medicine

## 2017-04-23 ENCOUNTER — Encounter: Payer: Self-pay | Admitting: Family Medicine

## 2017-04-23 ENCOUNTER — Ambulatory Visit: Payer: Self-pay

## 2017-04-23 VITALS — BP 110/62 | HR 45 | Ht 68.0 in | Wt 167.0 lb

## 2017-04-23 DIAGNOSIS — S92001D Unspecified fracture of right calcaneus, subsequent encounter for fracture with routine healing: Secondary | ICD-10-CM | POA: Diagnosis not present

## 2017-04-23 DIAGNOSIS — M79671 Pain in right foot: Secondary | ICD-10-CM

## 2017-04-23 NOTE — Assessment & Plan Note (Signed)
Mostly healed at this time.  Discussed icing regimen and home exercise.  Continue the vitamin D, discussed proper shoes.  Patient was moving and I think this can exacerbated some of the underlying problem.  Follow-up with me again in 8 weeks

## 2017-04-23 NOTE — Patient Instructions (Signed)
Good to see you  Catherine Bell is your friend.  Stay active.  Keep using the donut or the heel lift a little  Avoid being barefoot More elliptical or biking or swimming for one more month If hiking, really, really good shoes See me again in 8 weeks if not perfect

## 2017-06-25 ENCOUNTER — Ambulatory Visit: Payer: BC Managed Care – PPO | Admitting: Family Medicine

## 2024-01-02 ENCOUNTER — Telehealth: Payer: Self-pay

## 2024-01-02 NOTE — Telephone Encounter (Signed)
 RN attempted to contact patient regarding being overdue for next colonoscopy. Phone number is outdated; unable to leave vm.
